# Patient Record
Sex: Female | Born: 1980 | Race: White | Hispanic: No | Marital: Single | Smoking: Never smoker
Health system: Southern US, Community
[De-identification: ages and names within clinical notes are randomized; demographics above are authoritative.]

## PROBLEM LIST (undated history)

## (undated) DIAGNOSIS — F419 Anxiety disorder, unspecified: Secondary | ICD-10-CM

## (undated) DIAGNOSIS — T4145XA Adverse effect of unspecified anesthetic, initial encounter: Secondary | ICD-10-CM

## (undated) DIAGNOSIS — G4700 Insomnia, unspecified: Secondary | ICD-10-CM

## (undated) DIAGNOSIS — J189 Pneumonia, unspecified organism: Secondary | ICD-10-CM

## (undated) DIAGNOSIS — Z8489 Family history of other specified conditions: Secondary | ICD-10-CM

## (undated) DIAGNOSIS — N390 Urinary tract infection, site not specified: Secondary | ICD-10-CM

## (undated) DIAGNOSIS — K219 Gastro-esophageal reflux disease without esophagitis: Secondary | ICD-10-CM

## (undated) DIAGNOSIS — T8859XA Other complications of anesthesia, initial encounter: Secondary | ICD-10-CM

## (undated) DIAGNOSIS — J302 Other seasonal allergic rhinitis: Secondary | ICD-10-CM

## (undated) HISTORY — PX: ABDOMINAL HYSTERECTOMY: SHX81

## (undated) HISTORY — PX: UPPER GASTROINTESTINAL ENDOSCOPY: SHX188

## (undated) HISTORY — PX: BREAST SURGERY: SHX581

---

## 2004-03-24 ENCOUNTER — Ambulatory Visit (HOSPITAL_COMMUNITY): Admission: RE | Admit: 2004-03-24 | Discharge: 2004-03-24 | Payer: Self-pay | Admitting: Obstetrics

## 2004-03-24 IMAGING — US US OB COMP +14 WK
1 series · 13 of 28 positions shown · non-contrast
Comparison: none

CLINICAL DATA: 19 week 2 day gestational age by LMP.  Evaluate dating and anatomy.

[Series 1: us ob comp +14 wk · 0.27mm/px · 13 of 105 slices shown]
[im 4/105]
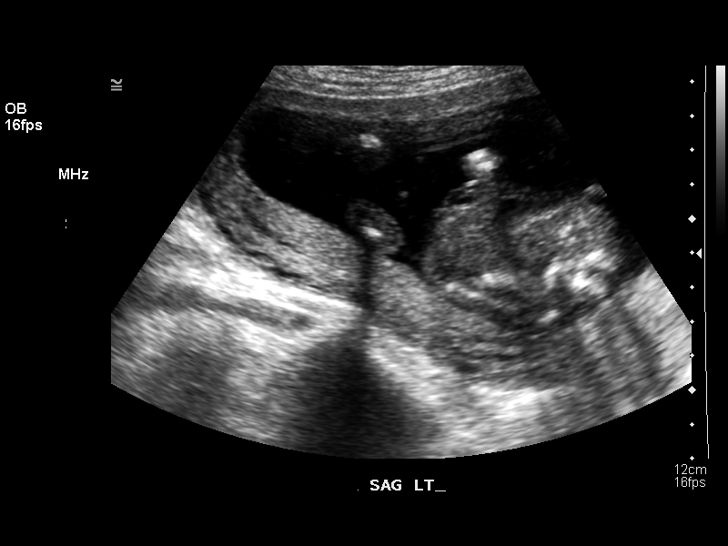
[im 12/105]
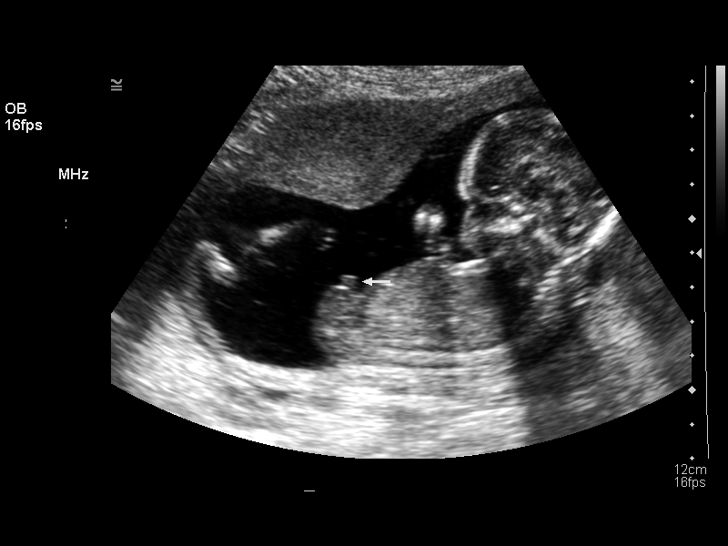
[im 20/105]
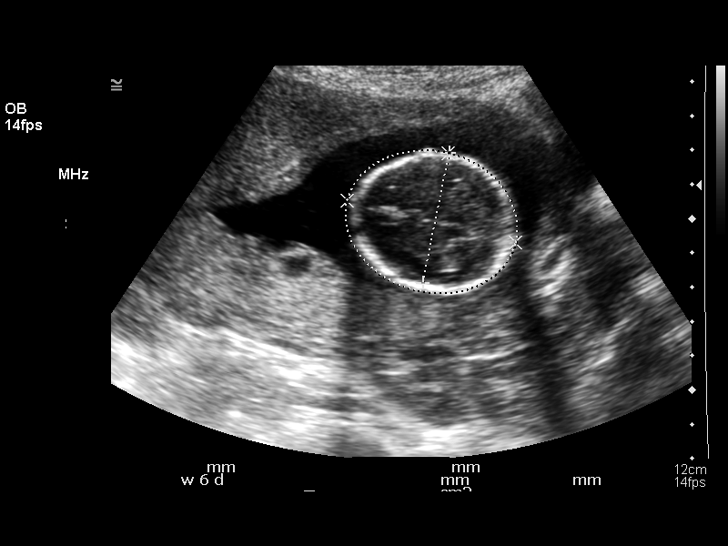
[im 27/105]
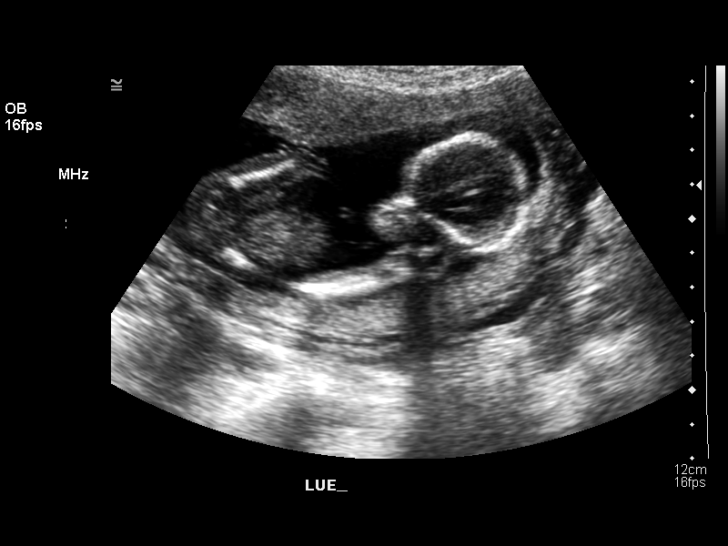
[im 35/105]
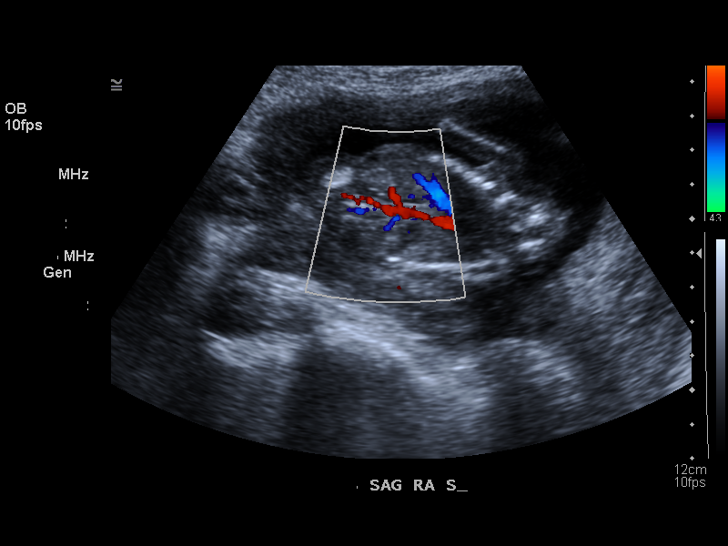
[im 43/105]
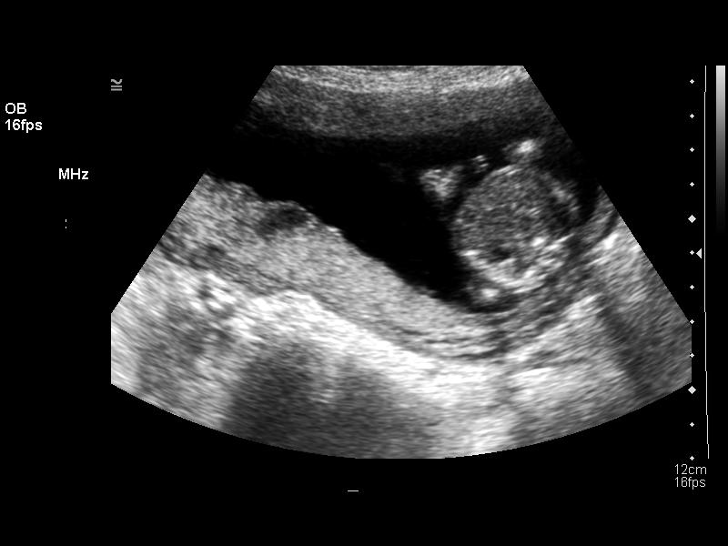
[im 54/105]
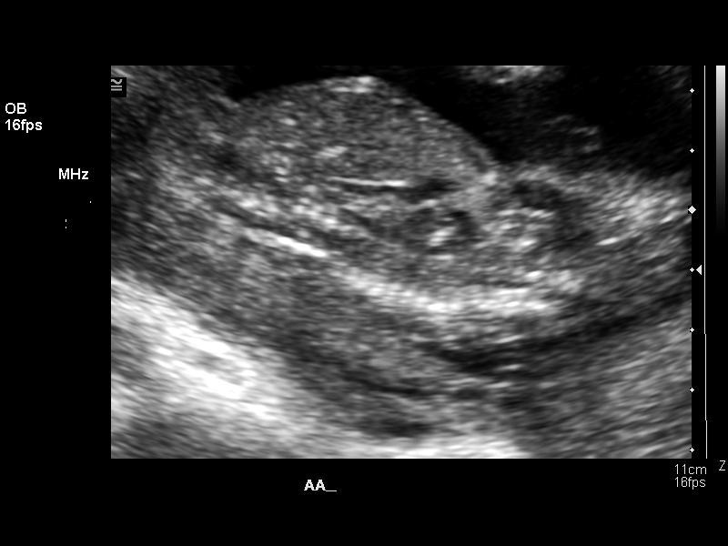
[im 62/105]
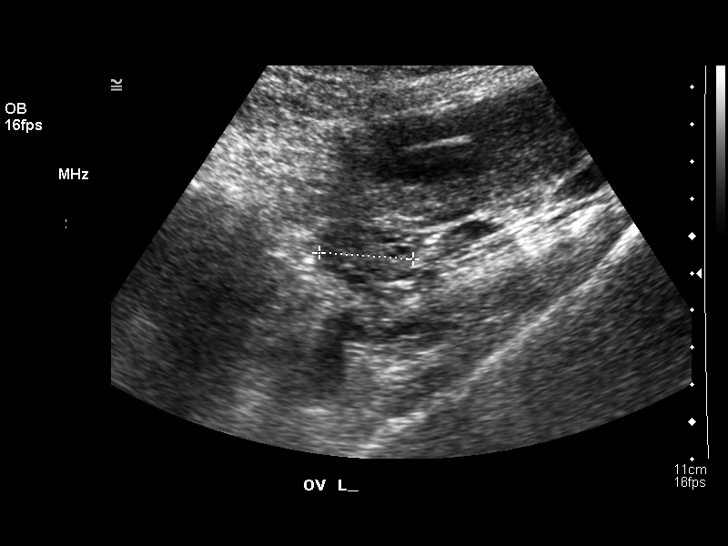
[im 70/105]
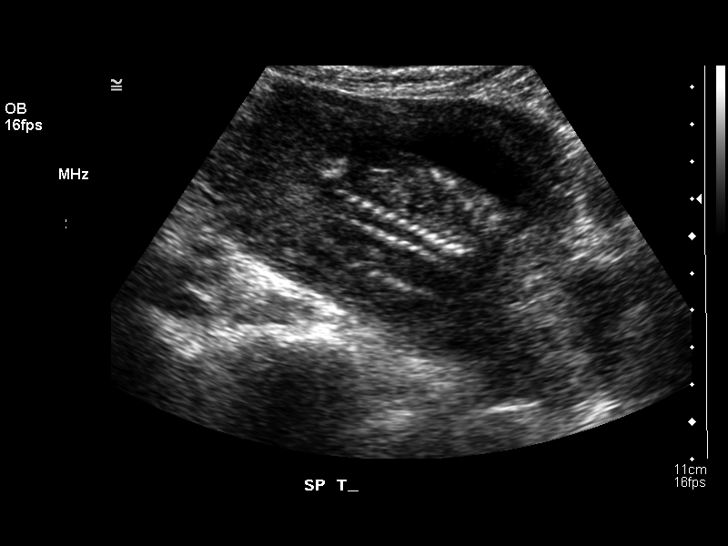
[im 78/105]
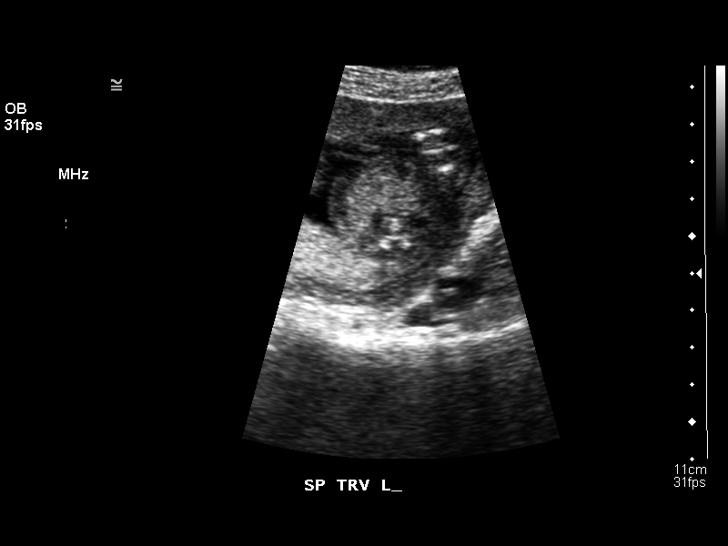
[im 85/105]
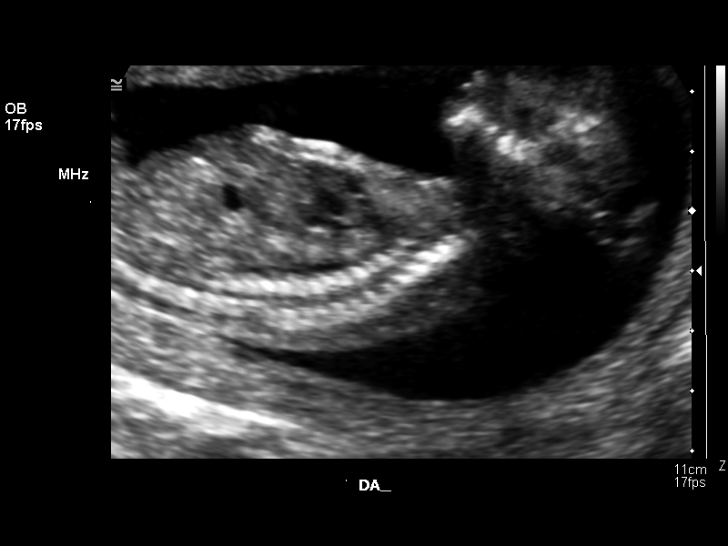
[im 93/105]
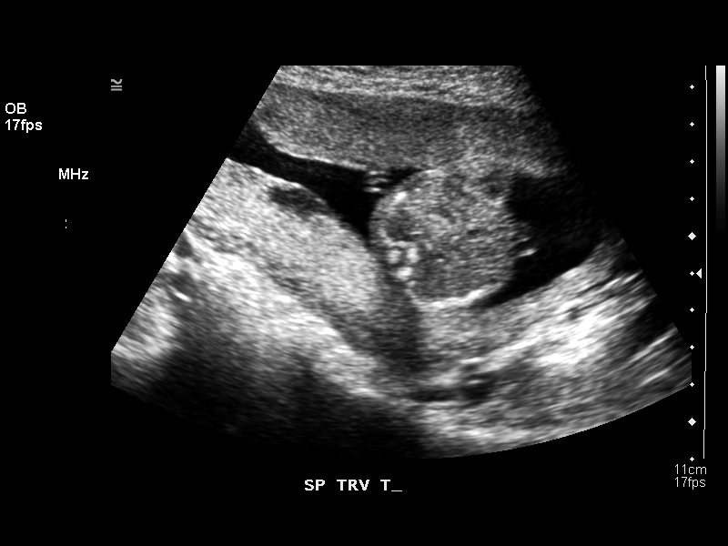
[im 101/105]
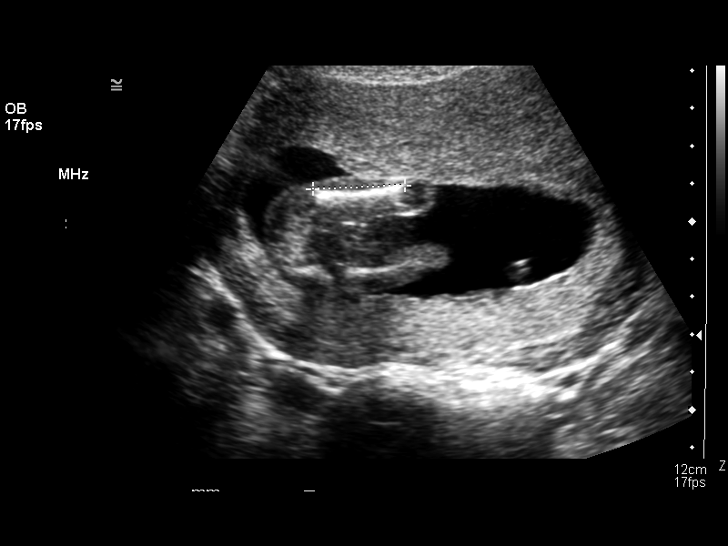

[13 of 28 positions shown; findings below may reference images not displayed]

OBSTETRICAL ULTRASOUND:
 Number of Fetuses:  1
 Heart Rate:  159
 Movement:  Yes
 Breathing:  No  
 Presentation:  Cephalic
 Placental Location:  Posterior
 Grade:  I
 Previa:  No
 Amniotic Fluid (Subjective):  Normal
 Amniotic Fluid (Objective):   5.3 cm Vertical pocket 

 FETAL BIOMETRY
 BPD:   3.8 cm  17 w 5 d
 HC:   14.3 cm  17 w 4 d
 AC:   11.8 cm   17 w 4 d
 FL:    2.4 cm   17 w 3 d

 MEAN GA:  17 w 4 d

 FETAL ANATOMY
 Lateral Ventricles:    Visualized 
 Thalami/CSP:      Visualized 
 Posterior Fossa:  Visualized 
 Nuchal Region:    Visualized 
 Spine:      Visualized 
 4 Chamber Heart on Left:      Visualized 
 Stomach on Left:      Visualized 
 3 Vessel Cord:    Visualized 
 Cord Insertion site:    Visualized 
 Kidneys:  Visualized 
 Bladder:  Visualized 
 Extremities:      Visualized 

 ADDITIONAL ANATOMY VISUALIZED:  LVOT, RVOT, upper lip, orbits, profile, diaphragm, heel, 5th digit, ductal arch, aortic arch, and male genitalia.

 MATERNAL UTERINE AND ADNEXAL FINDINGS
 Cervix:   3.5 cm Transabdominally
 Both ovaries are visualized and are normal in appearance.
IMPRESSION: 1.  Single living intrauterine fetus with mean gestational age of 17 weeks and 4 days and sonographic EDC of [DATE].  This is two weeks behind LMP suggesting that LMP is inaccurate.
 2.  No evidence of fetal anatomic abnormality.  
 3.  No evidence of maternal uterine or adnexal abnormality.

 </u12:p>

## 2004-07-16 ENCOUNTER — Ambulatory Visit (HOSPITAL_COMMUNITY): Admission: RE | Admit: 2004-07-16 | Discharge: 2004-07-16 | Payer: Self-pay | Admitting: Obstetrics & Gynecology

## 2004-07-16 IMAGING — US US OB FOLLOW-UP
1 series · 13 of 28 positions shown · non-contrast
Comparison: none

CLINICAL DATA: 24-year-old female with 33 week 6 day gestation.  Evaluate growth.

[Series 1: us ob follow-up · 0.37mm/px · 13 of 33 slices shown]
[im 2/33]
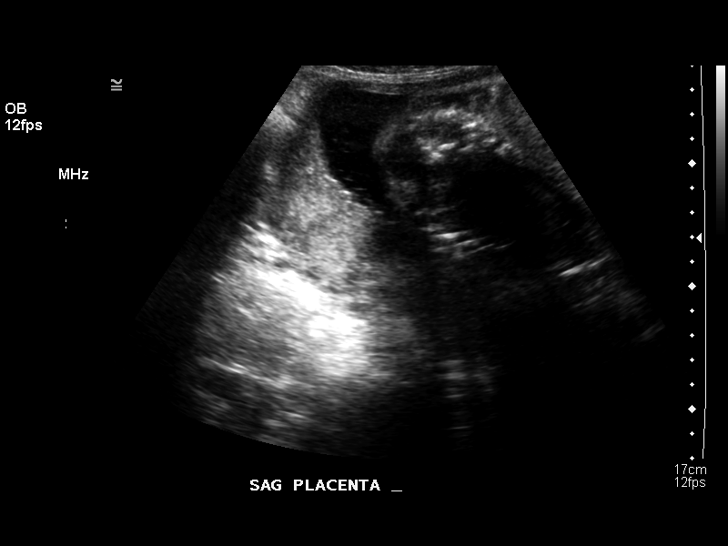
[im 4/33]
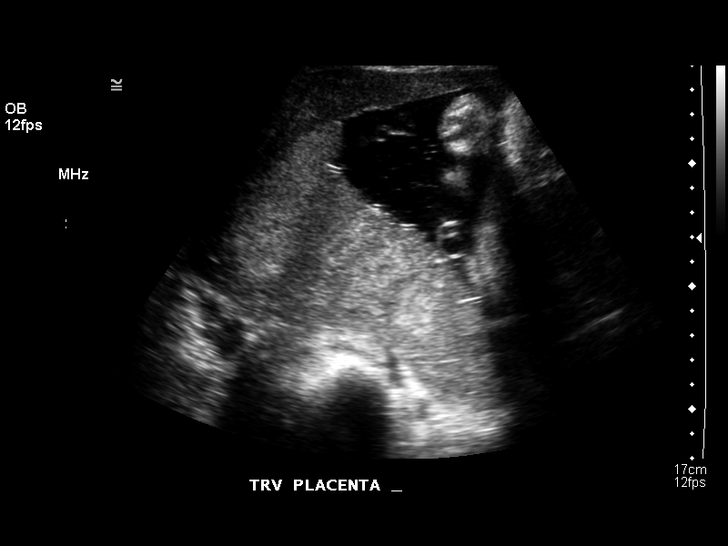
[im 6/33]
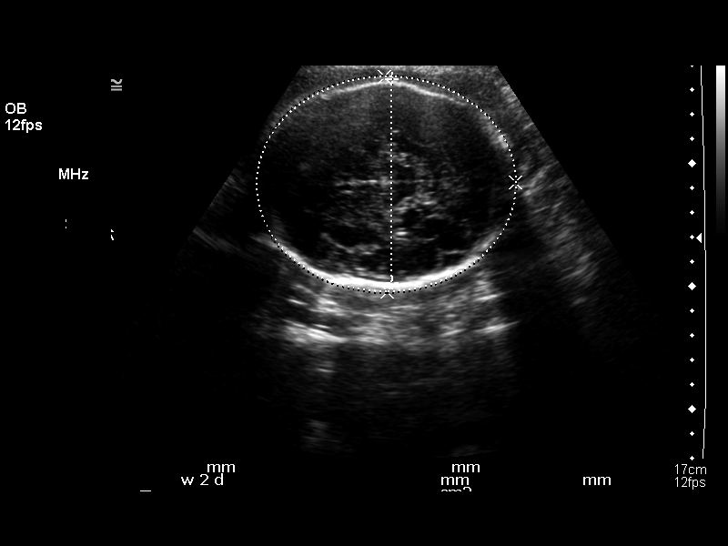
[im 9/33]
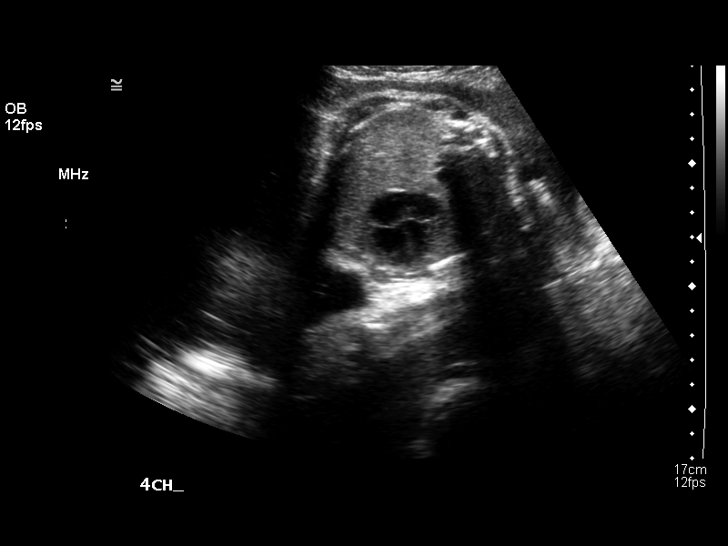
[im 11/33]
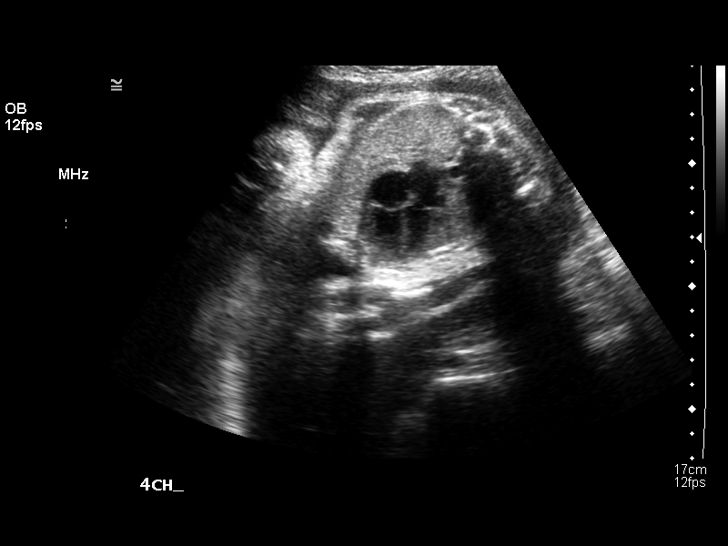
[im 14/33]
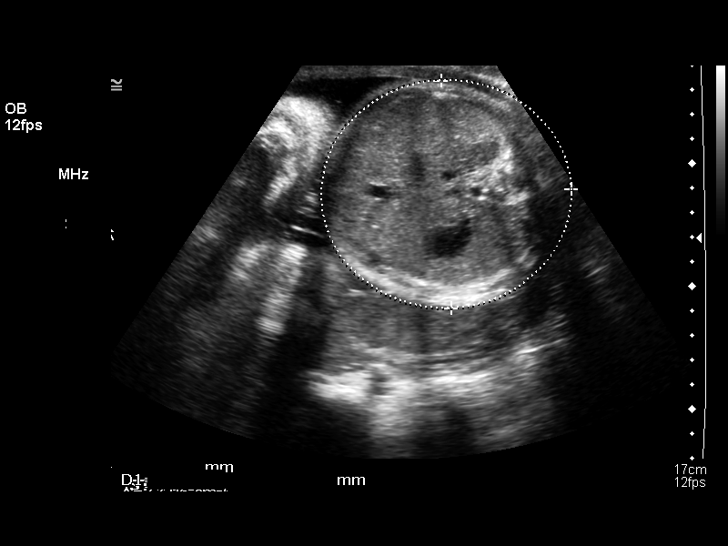
[im 17/33]
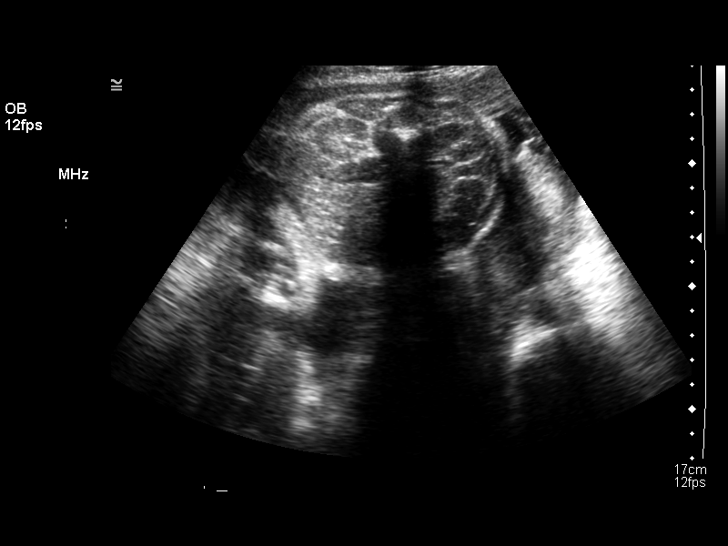
[im 19/33]
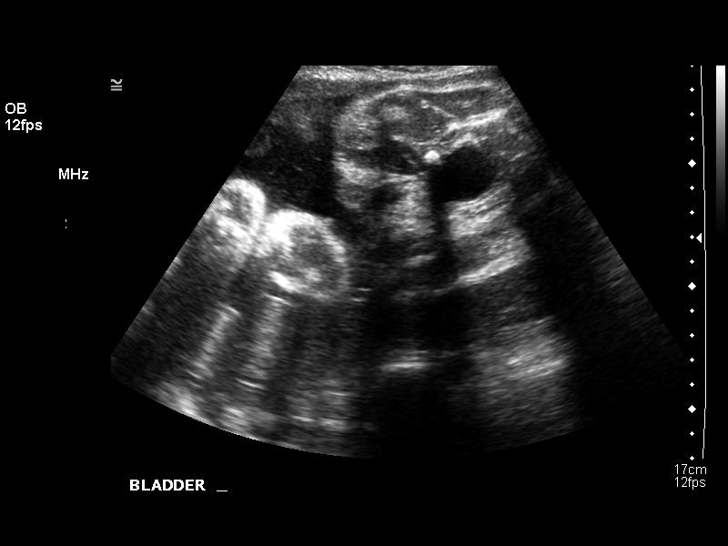
[im 22/33]
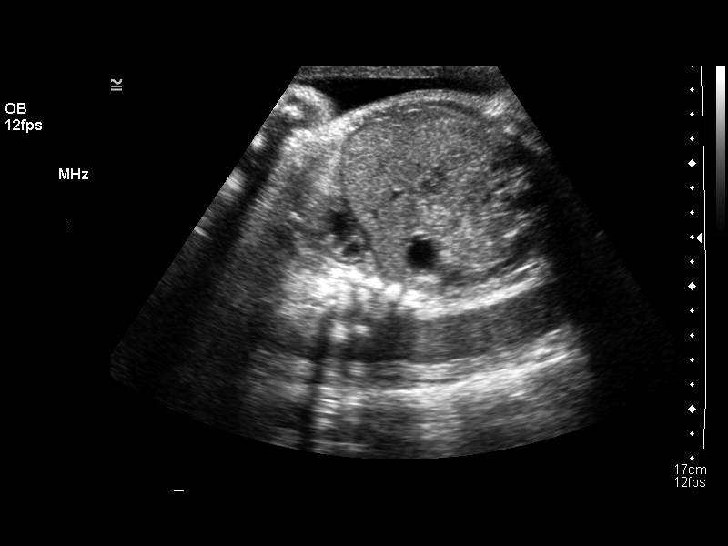
[im 24/33]
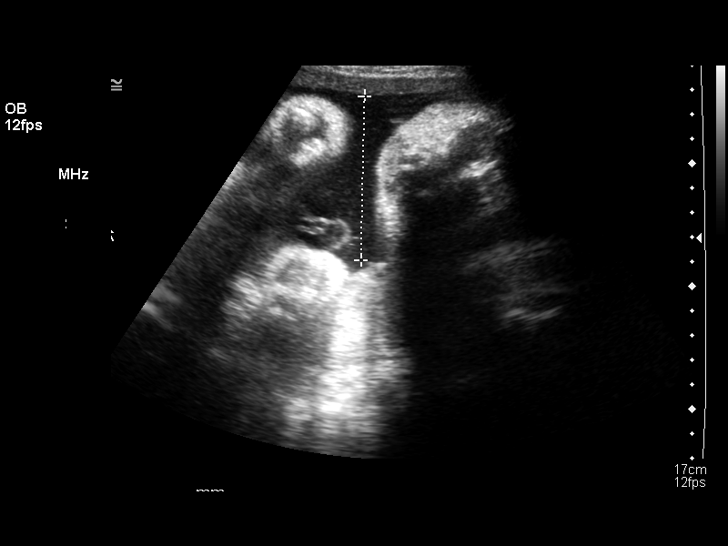
[im 27/33]
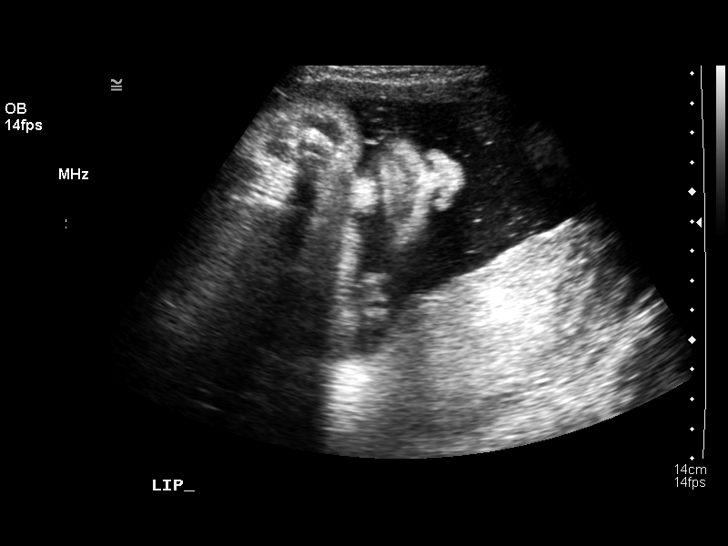
[im 29/33]
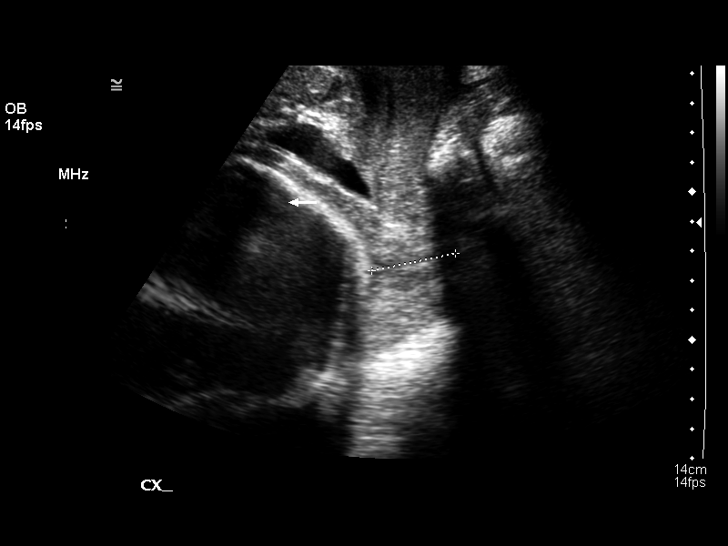
[im 31/33]
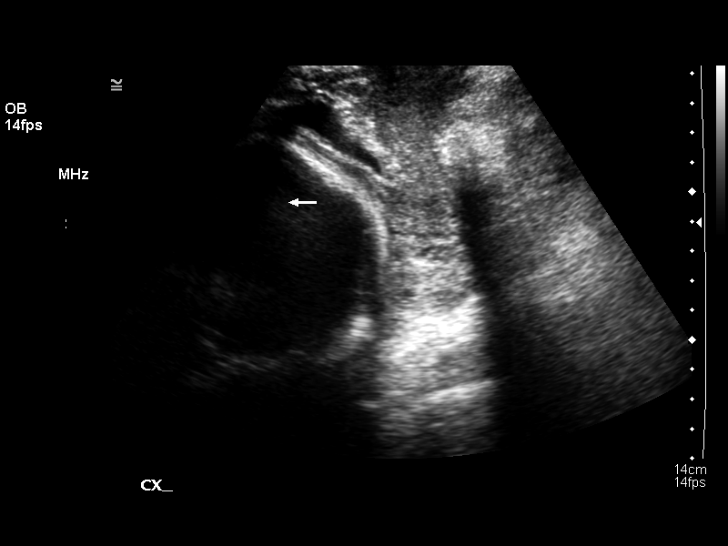

[13 of 28 positions shown; findings below may reference images not displayed]

OBSTETRICAL ULTRASOUND RE-EVALUATION:
 Number of Fetuses:  1
 Heart Rate:  144
 Movement:  Yes
 Breathing:  Yes
 Presentation:  Cephalic
 Placental Location:  Posterior
 Grade:  I
 Previa:  No
 Amniotic Fluid (subjective):  Normal
 Amniotic Fluid (objective):  15.6 cm AFI (5th -95th%ile = 8.1 – 24.8 cm for 34 wks)

 FETAL BIOMETRY
 BPD:  8.2 cm  33 w 9 d
 HC:  30.2 cm  33 w 4 d
 AC:  29.2 cm  33 w 2 d
 FL:   6.4 cm   33 w 0 d

 Mean GA:  33 w 2 d
 Assigned GA:  33 w 6 d

 EFW:  [W6] g (H) 25th – 50th%ile ([W6] – [W6] g) For 34 wks

 FETAL ANATOMY
 Lateral Ventricles:  Visualized 
 Thalami/CSP:  Previously seen 
 Posterior Fossa:  Previously seen 
 Nuchal Region:  Previously seen 
 Spine:  Previously seen 
 4 Chamber Heart on Left:  Visualized 
 Stomach on Left:  Visualized 
 3 Vessel Cord:  Previously seen 
 Cord Insertion Site:  Previously seen 
 Kidneys:  Visualized 
 Bladder:  Visualized 
 Extremities:  Previously seen 

 MATERNAL UTERINE AND ADNEXAL FINDINGS
 Cervix:  2.6 cm Translabially
IMPRESSION: 1.  Single living intrauterine gestation in cephalic presentation with assigned gestational age of 33 weeks 6 days and estimated gestational age by this ultrasound of 33 weeks 2 days, appropriate growth since the previous study.
 2.  Normal amniotic fluid volume with posterior placenta.  No previa.
 3.  Fetal anatomy as described.

## 2004-07-26 ENCOUNTER — Observation Stay (HOSPITAL_COMMUNITY): Admission: AD | Admit: 2004-07-26 | Discharge: 2004-07-27 | Payer: Self-pay | Admitting: Obstetrics

## 2004-08-03 ENCOUNTER — Inpatient Hospital Stay (HOSPITAL_COMMUNITY): Admission: AD | Admit: 2004-08-03 | Discharge: 2004-08-05 | Payer: Self-pay | Admitting: Obstetrics

## 2007-03-03 ENCOUNTER — Ambulatory Visit: Payer: Self-pay | Admitting: Psychiatry

## 2007-03-03 ENCOUNTER — Inpatient Hospital Stay (HOSPITAL_COMMUNITY): Admission: AD | Admit: 2007-03-03 | Discharge: 2007-03-06 | Payer: Self-pay | Admitting: Psychiatry

## 2007-03-03 ENCOUNTER — Emergency Department (HOSPITAL_COMMUNITY): Admission: EM | Admit: 2007-03-03 | Discharge: 2007-03-03 | Payer: Self-pay | Admitting: Emergency Medicine

## 2007-11-05 ENCOUNTER — Inpatient Hospital Stay (HOSPITAL_COMMUNITY): Admission: AD | Admit: 2007-11-05 | Discharge: 2007-11-05 | Payer: Self-pay | Admitting: Obstetrics & Gynecology

## 2007-11-05 IMAGING — US US PELVIS COMPLETE MODIFY
1 series · 14 of 25 positions shown · non-contrast
Comparison: None

Clinical Data abdominal pain

 TRANSABDOMINAL ULTRASOUND OF PELVIS
TECHNIQUE: Transabdominal ultrasound examination of the pelvis was
performed including evaluation of the uterus, ovaries, adnexal
regions, and pelvic cul-de-sac.

[Series 1: us pelvis complete modify · 0.26mm/px · 14 of 39 slices shown]
[im 1/39]
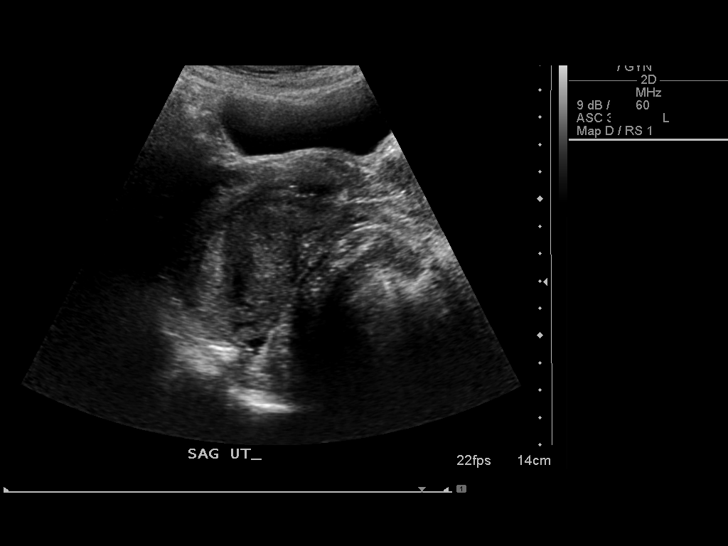
[im 4/39]
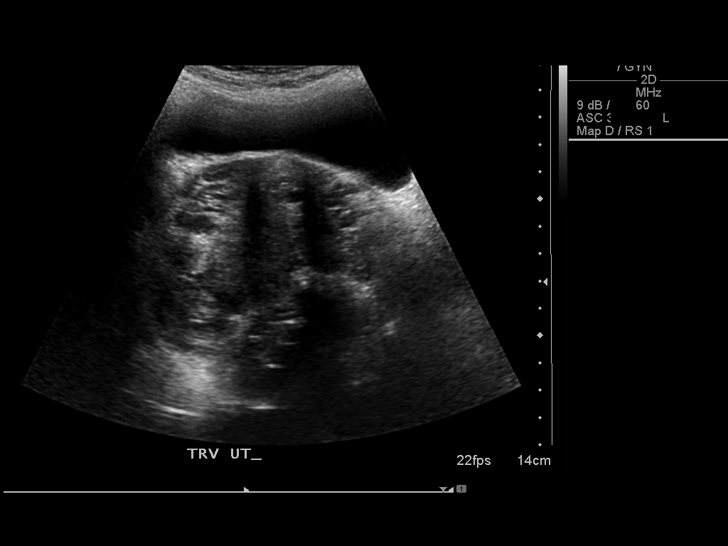
[im 7/39]
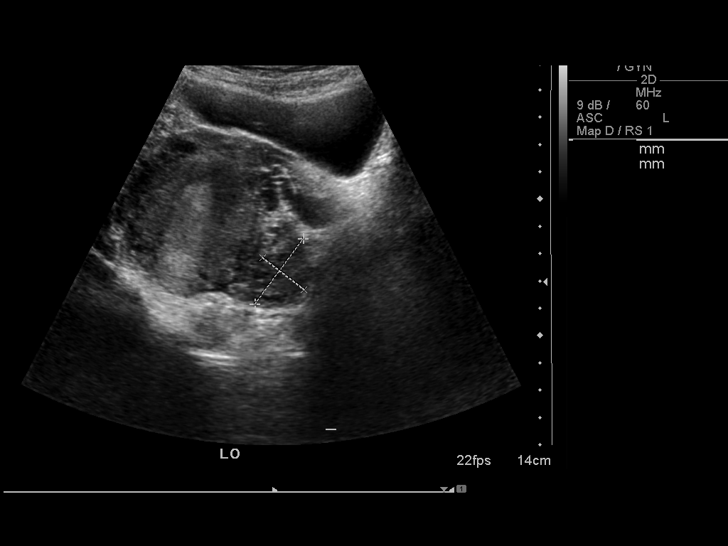
[im 10/39]
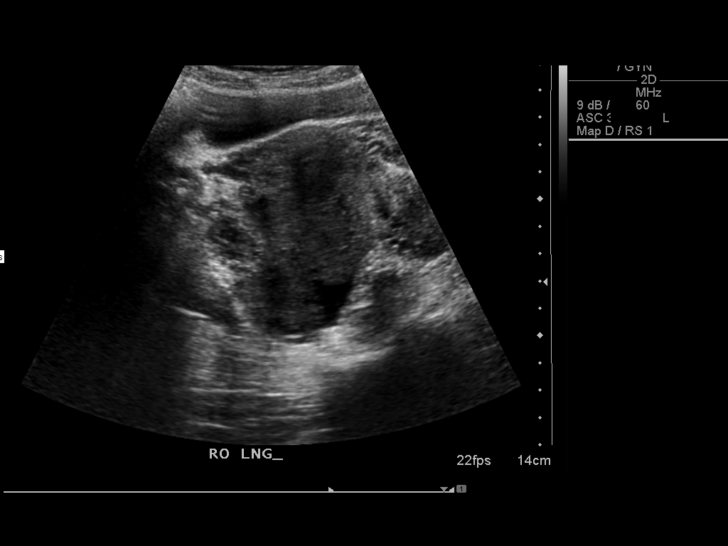
[im 13/39]
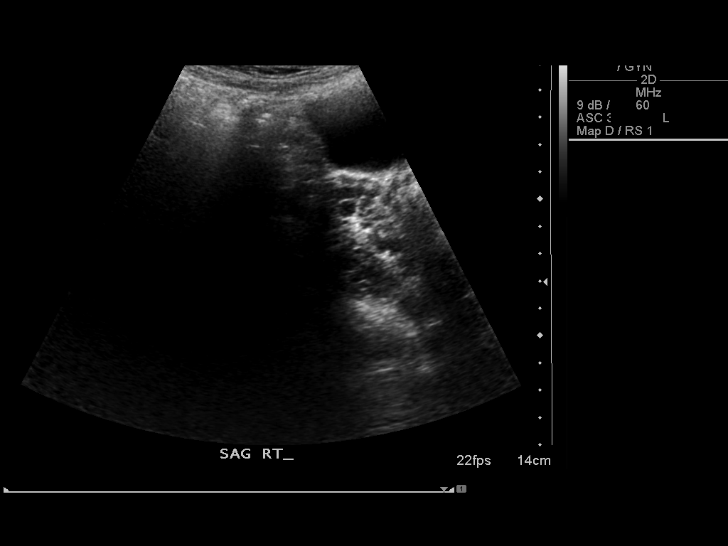
[im 15/39]
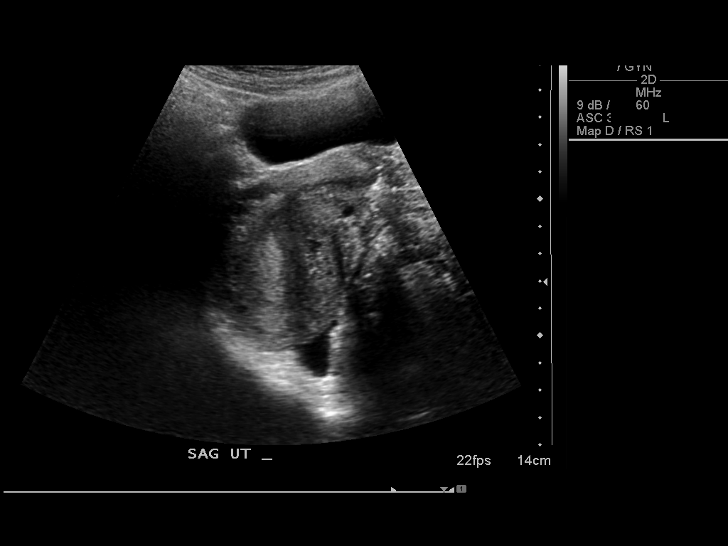
[im 18/39]
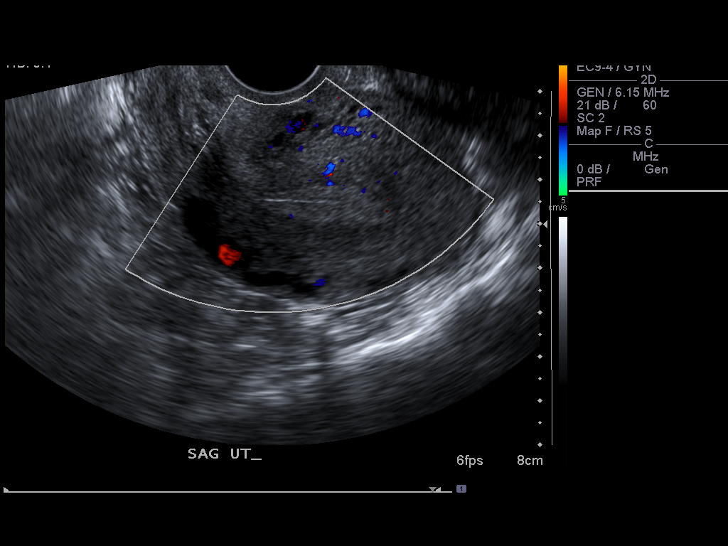
[im 21/39]
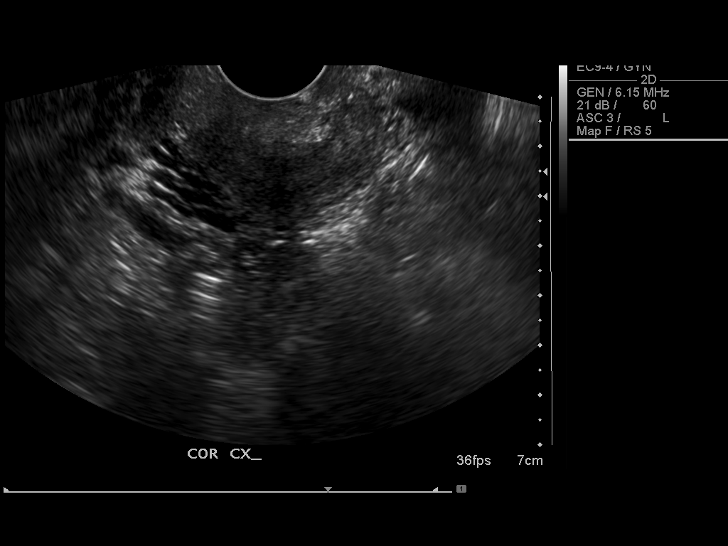
[im 24/39]
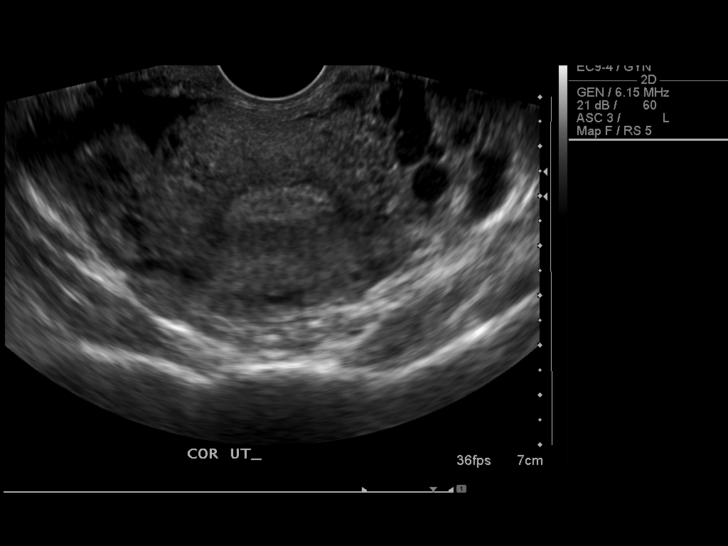
[im 26/39]
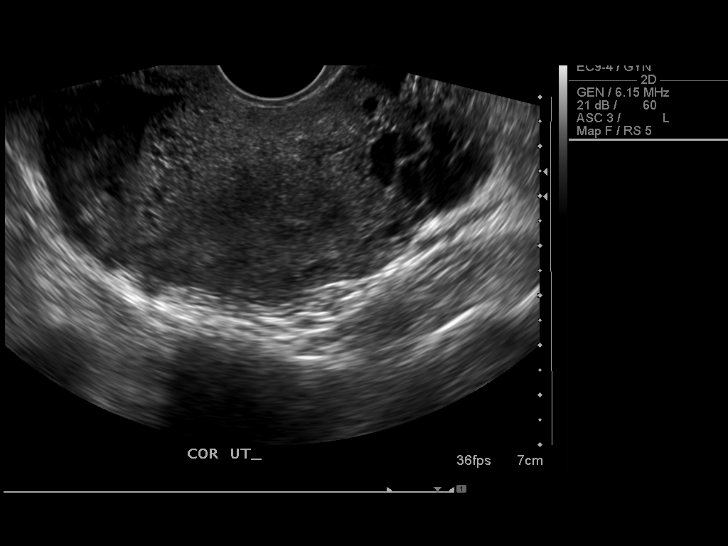
[im 29/39]
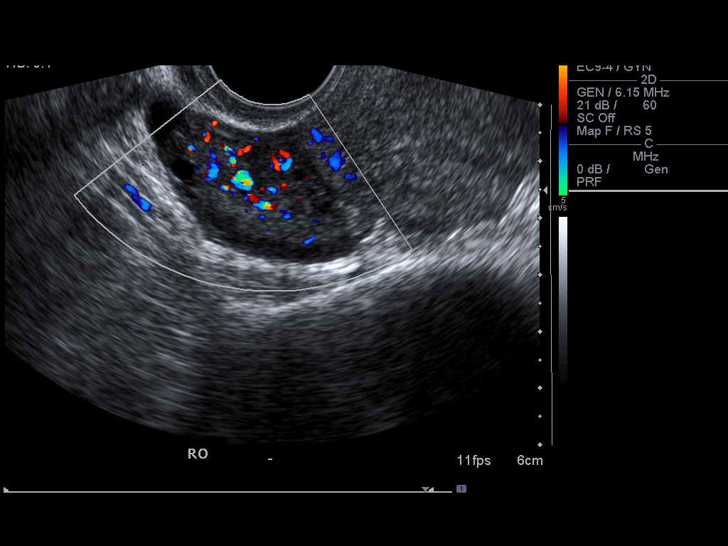
[im 32/39]
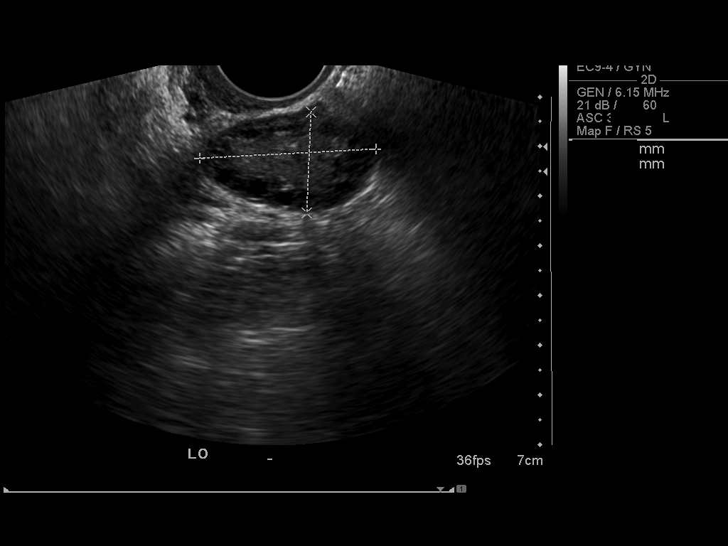
[im 35/39]
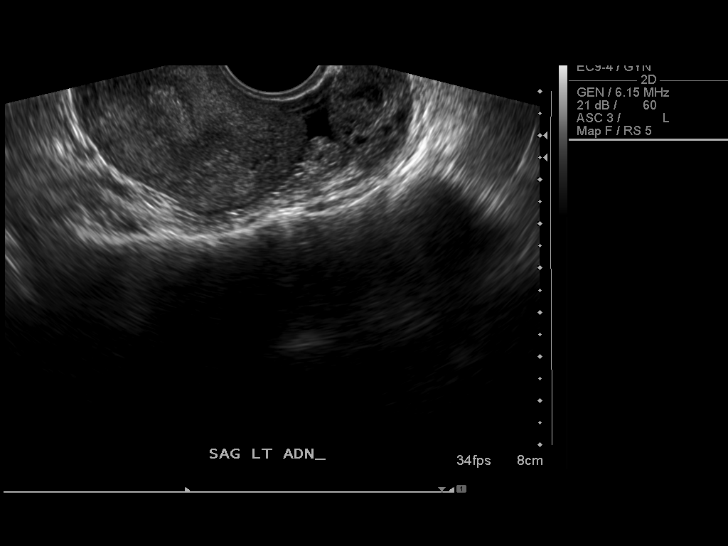
[im 39/39]
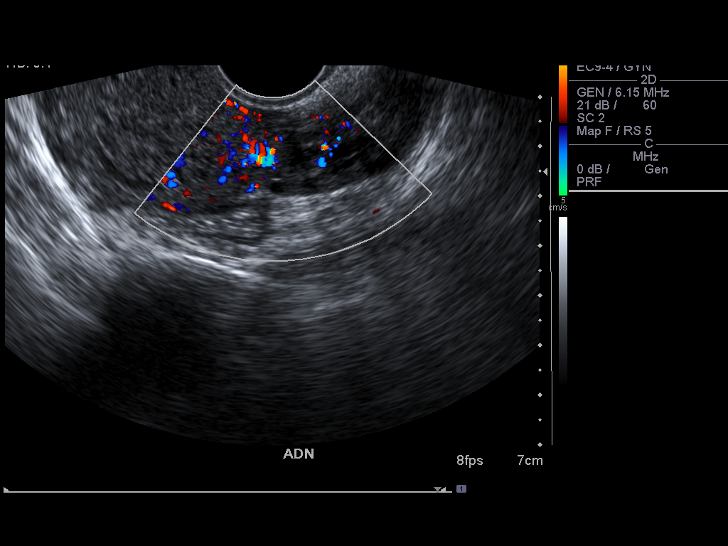

[14 of 25 positions shown; findings below may reference images not displayed]

FINDINGS: The uterus measures 9.2 x 4.4 x 5.1 cm.  The endometrial
stripe is normal.  The AP thickness equals 8.2 mm.

The right ovary is normal measuring 3.8 x 2.2 x 3.4 cm.  The left
ovary is normal measuring 3.6  x 2.0  x 2.3 cm. There is minor
fluid is nonspecific.  Adjacent to the left adnexa is a 0.6 x 0.6 x
0.9 cm area of increased echo texture.  This is of uncertain
etiology.  This does appear to be separate from the uterus and the
ovary.  I question whether this represents a small loop of bowel.
IMPRESSION: Uterus and ovaries normal by ultrasound.

## 2008-06-21 ENCOUNTER — Inpatient Hospital Stay (HOSPITAL_COMMUNITY): Admission: AD | Admit: 2008-06-21 | Discharge: 2008-06-22 | Payer: Self-pay | Admitting: Obstetrics & Gynecology

## 2008-06-21 ENCOUNTER — Ambulatory Visit: Payer: Self-pay | Admitting: Physician Assistant

## 2008-06-22 IMAGING — US US OB TRANSVAGINAL MODIFY
1 series · 14 of 28 positions shown · non-contrast
Comparison: none

OBSTETRICAL ULTRASOUND:
 This ultrasound exam was performed in the [HOSPITAL] Ultrasound Department.  The OB US report was generated in the AS system, and faxed to the ordering physician.  This report is also available in [REDACTED] PACS.

[Series 1: us ob comp less 14 wks · 14 of 44 slices shown]
[im 2/44]
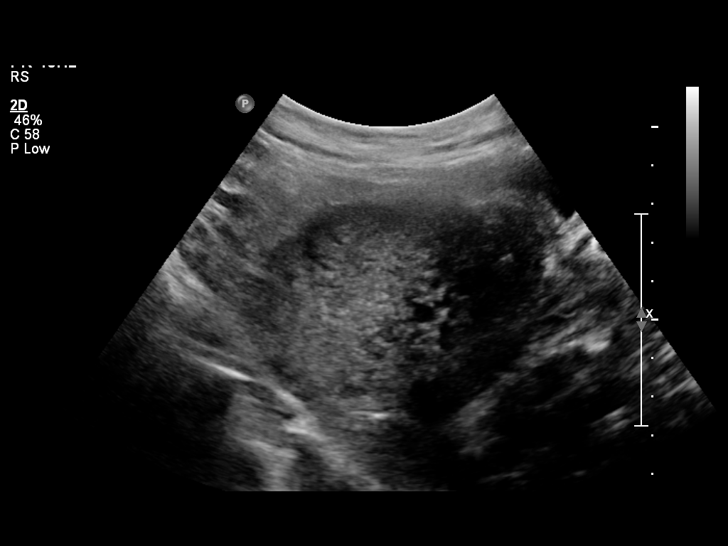
[im 5/44]
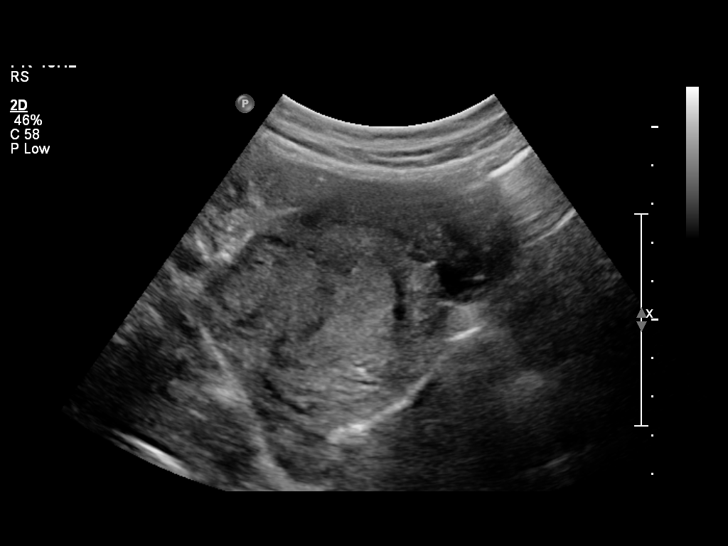
[im 8/44]
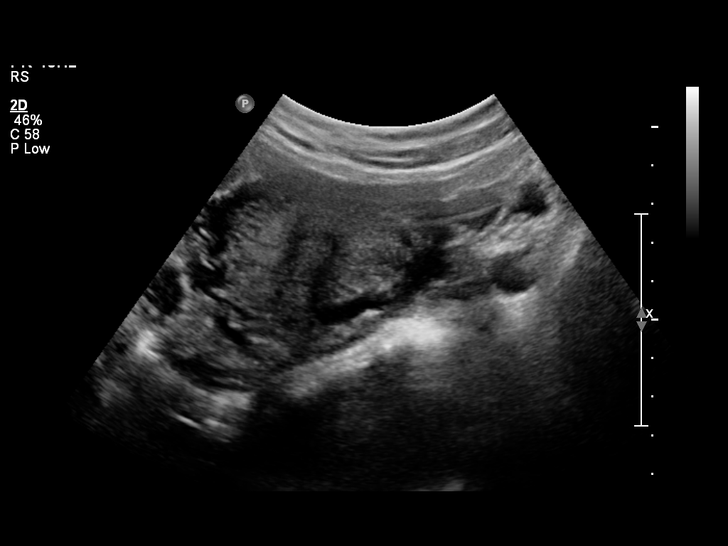
[im 12/44]
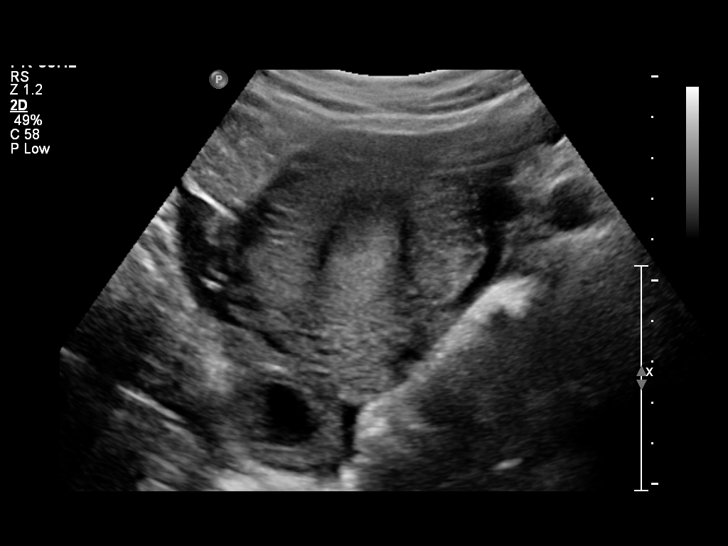
[im 15/44]
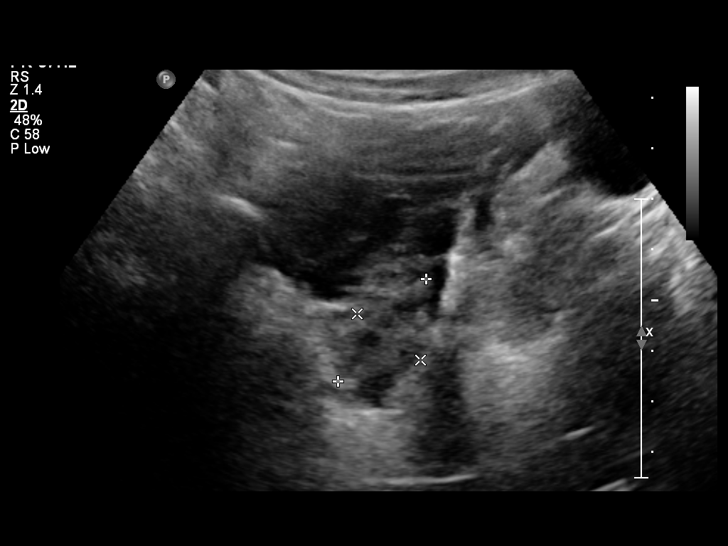
[im 18/44]
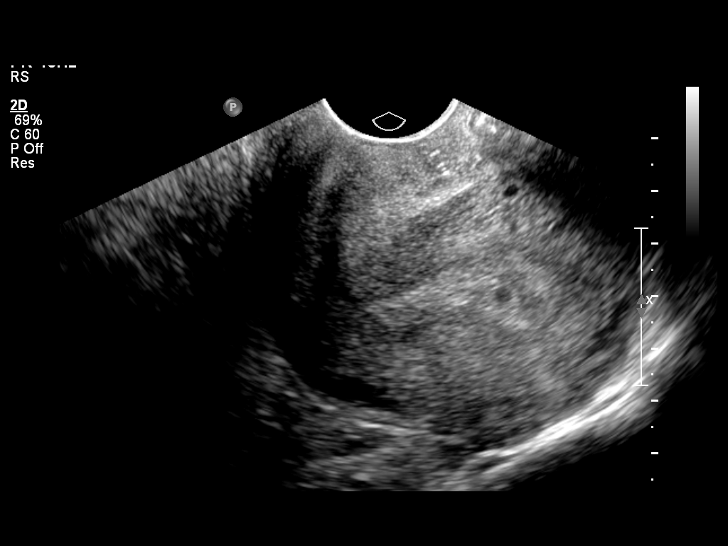
[im 21/44]
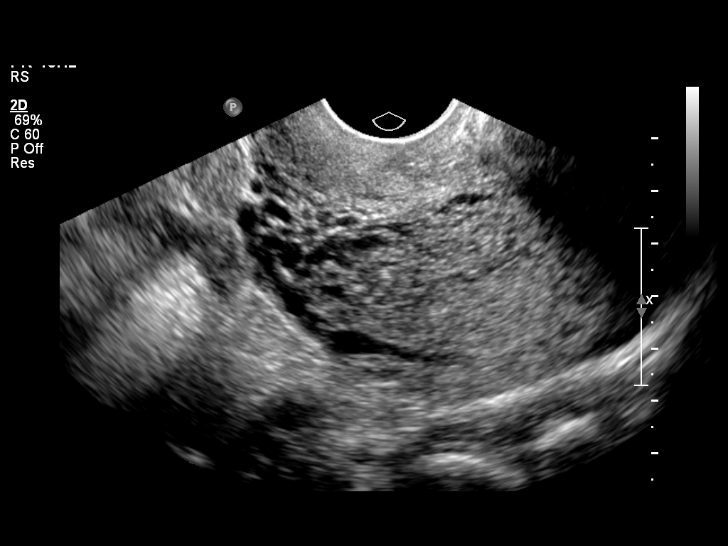
[im 24/44]
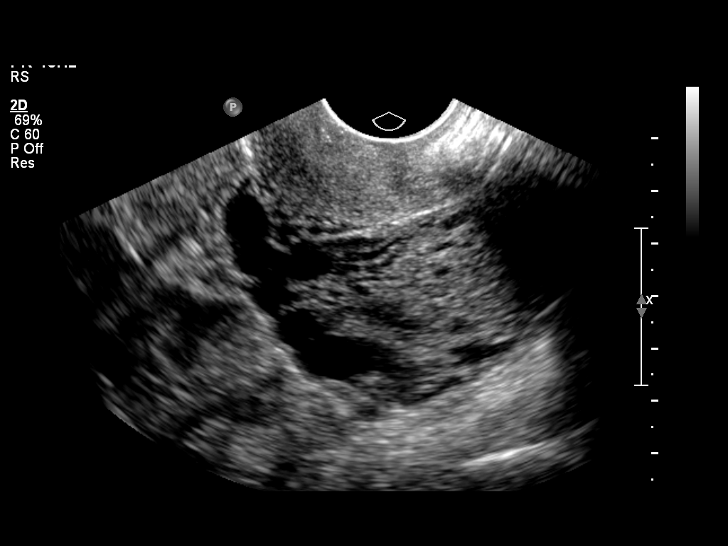
[im 28/44]
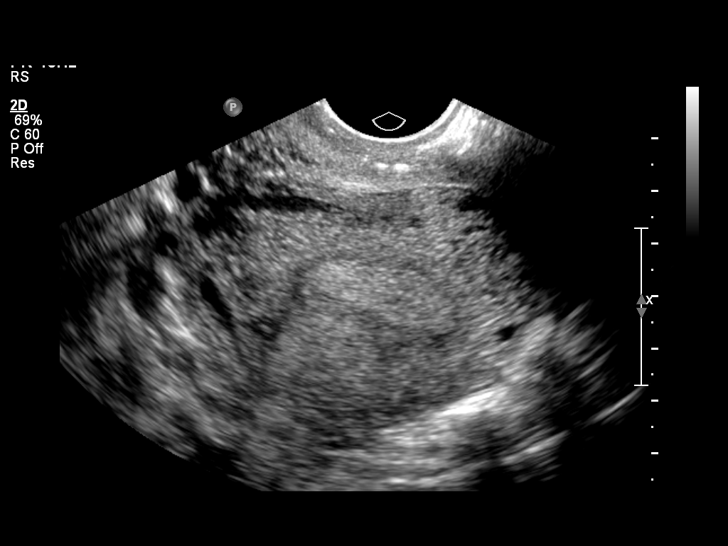
[im 31/44]
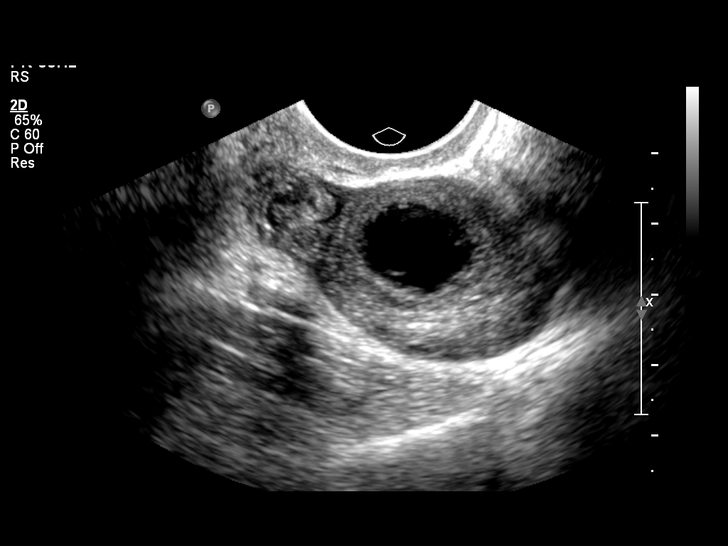
[im 34/44]
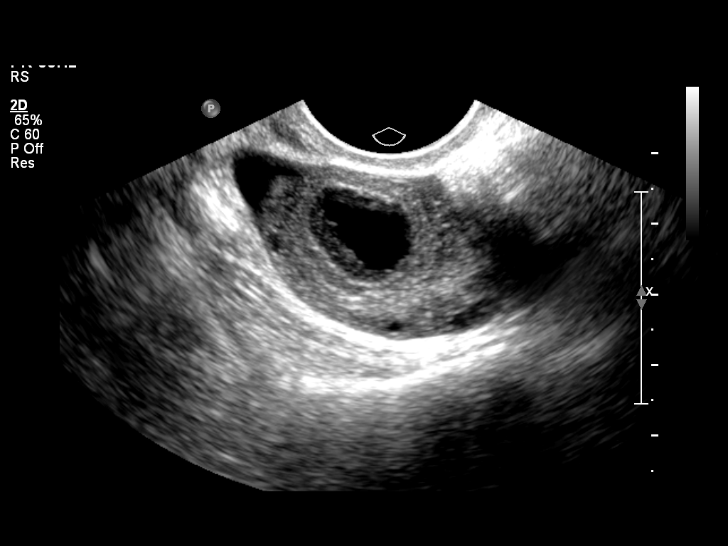
[im 37/44]
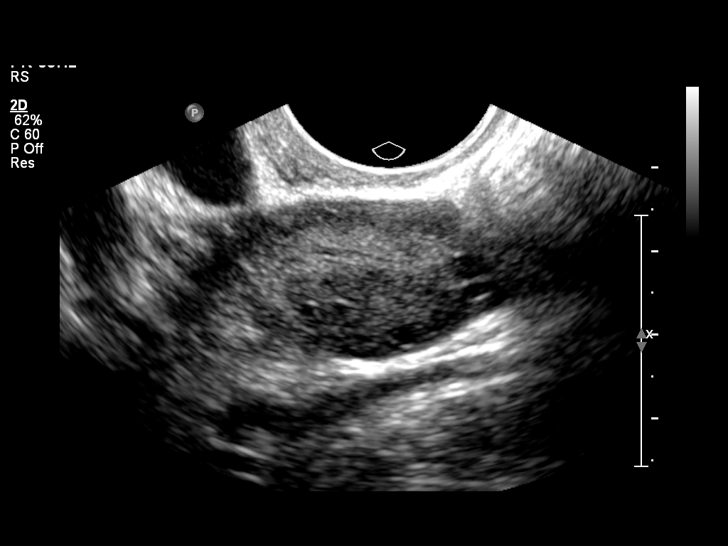
[im 40/44]
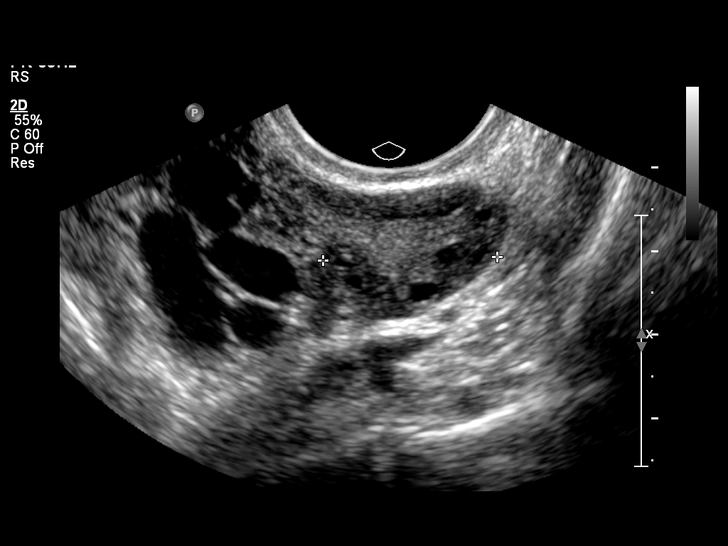
[im 44/44]
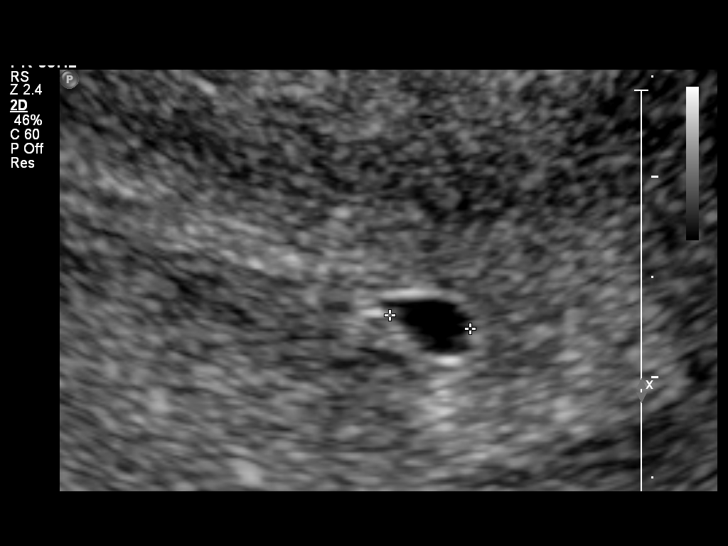

[14 of 28 positions shown; findings below may reference images not displayed]

IMPRESSION: See AS Obstetric US report.

## 2009-02-01 ENCOUNTER — Emergency Department (HOSPITAL_COMMUNITY): Admission: EM | Admit: 2009-02-01 | Discharge: 2009-02-01 | Payer: Self-pay | Admitting: Emergency Medicine

## 2009-02-01 IMAGING — CR DG FOOT COMPLETE 3+V*R*
3 series · 3 of 3 positions shown · non-contrast
Comparison: None

CLINICAL DATA: Fall.  Lateral foot pain.

RIGHT FOOT COMPLETE - 3+ VIEW

[t foot ap right]
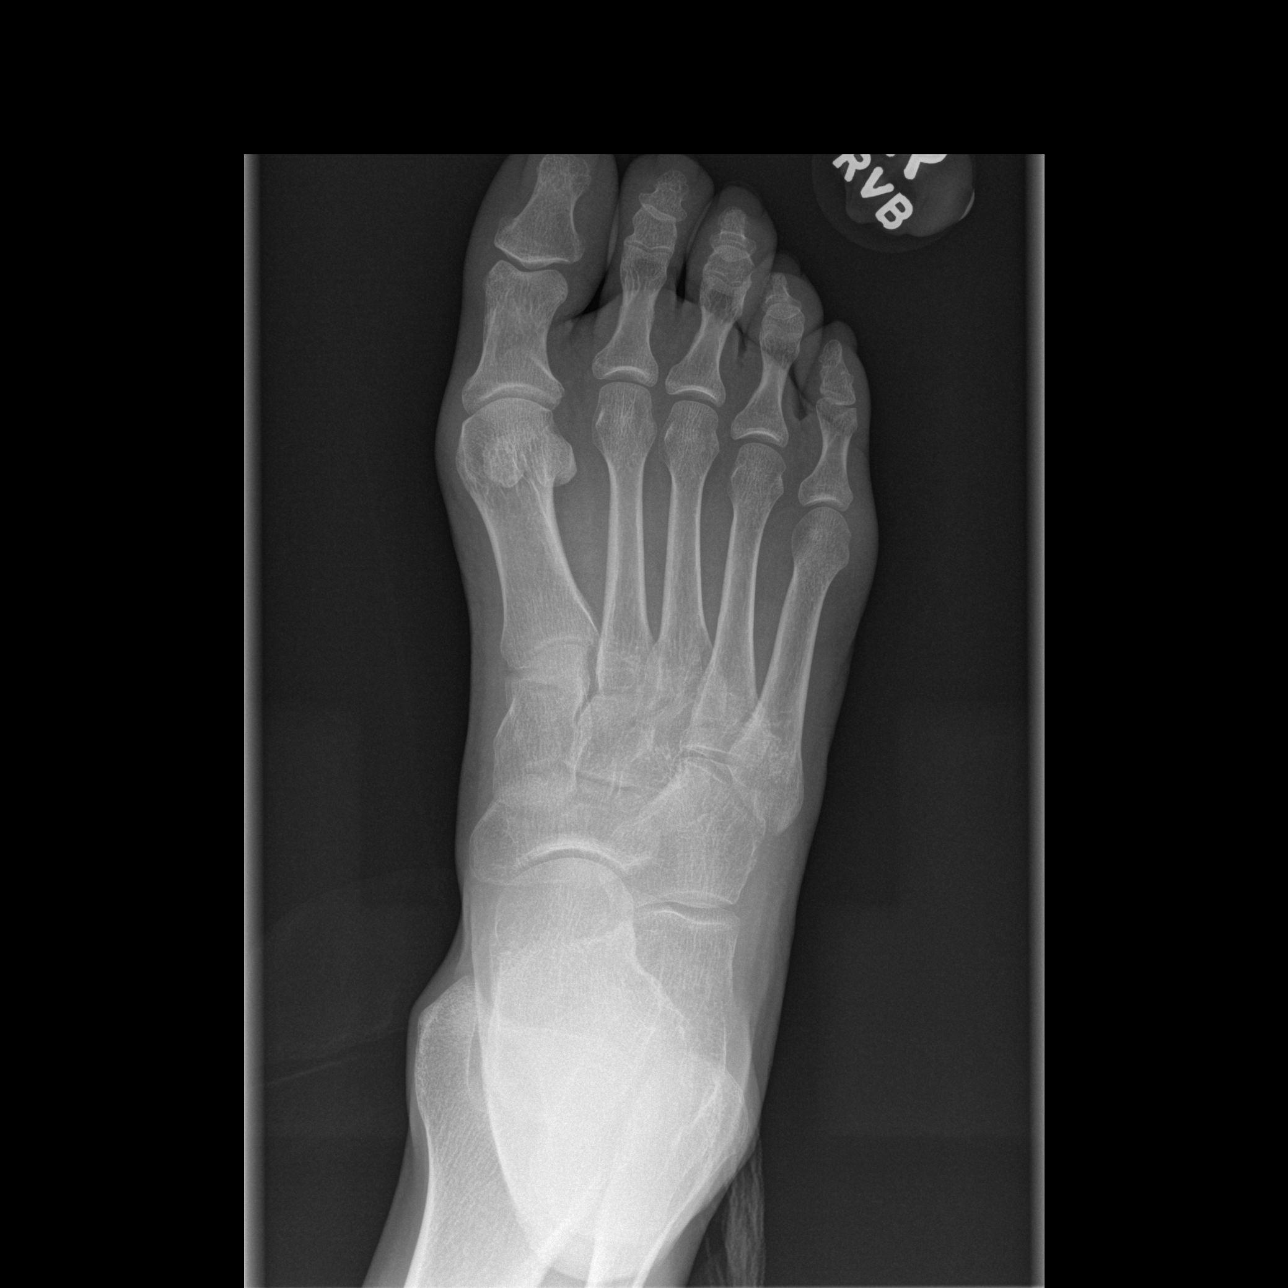

[t foot oblique right]
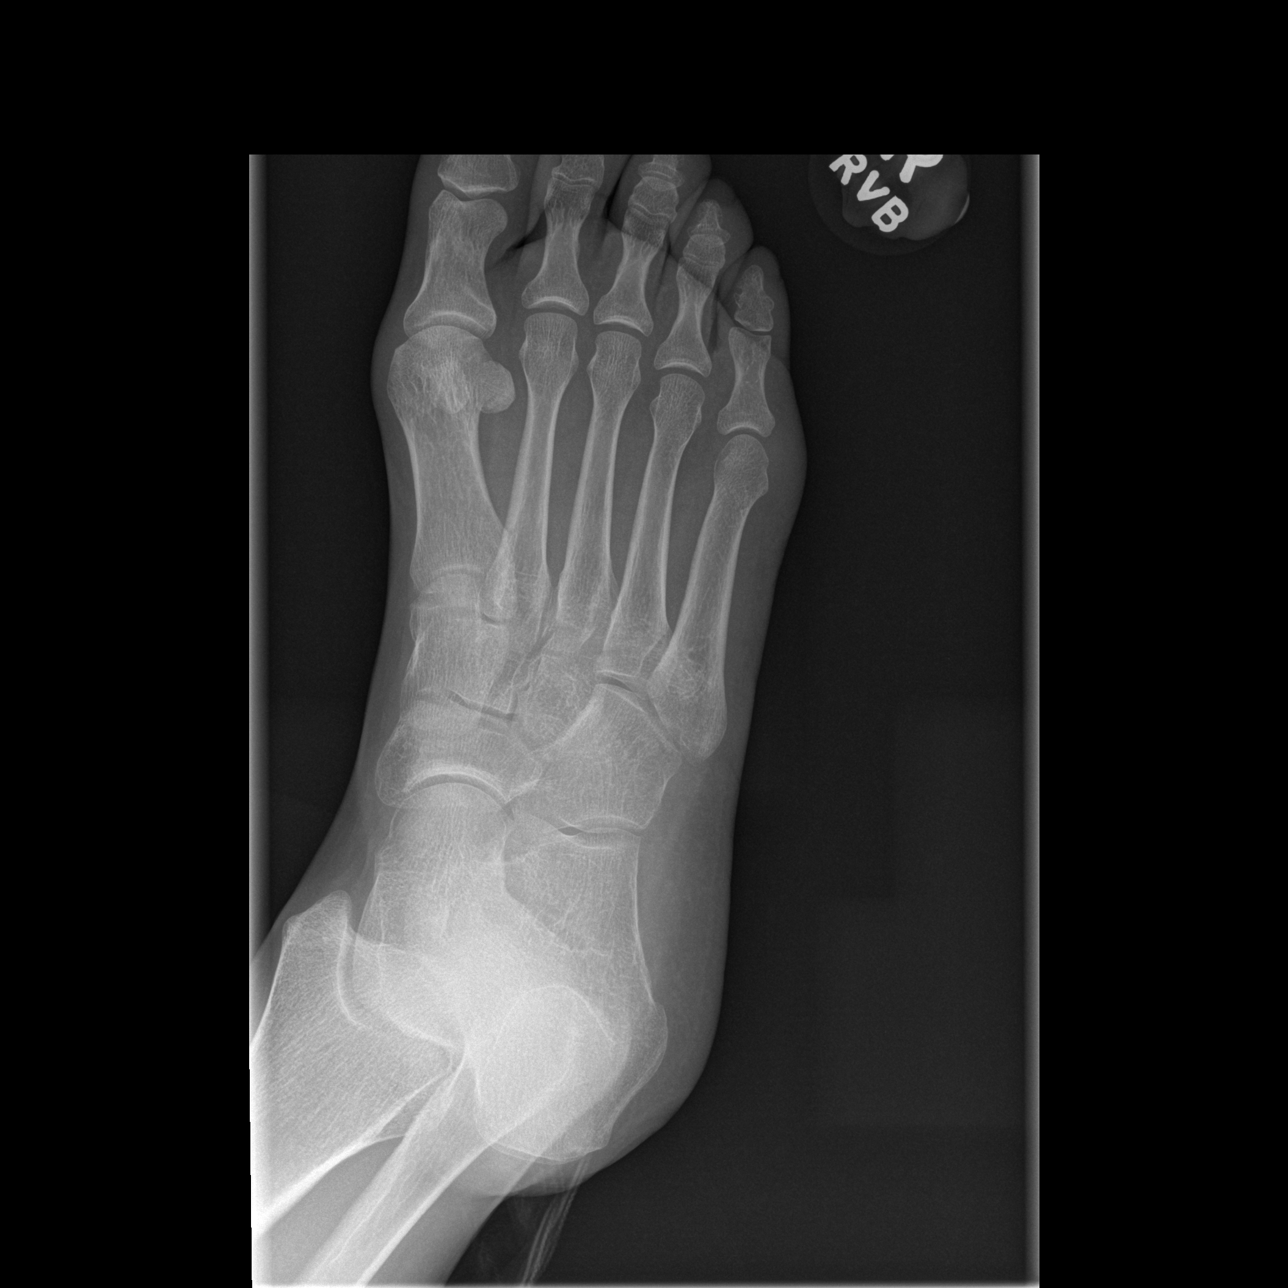

[t foot lat right]
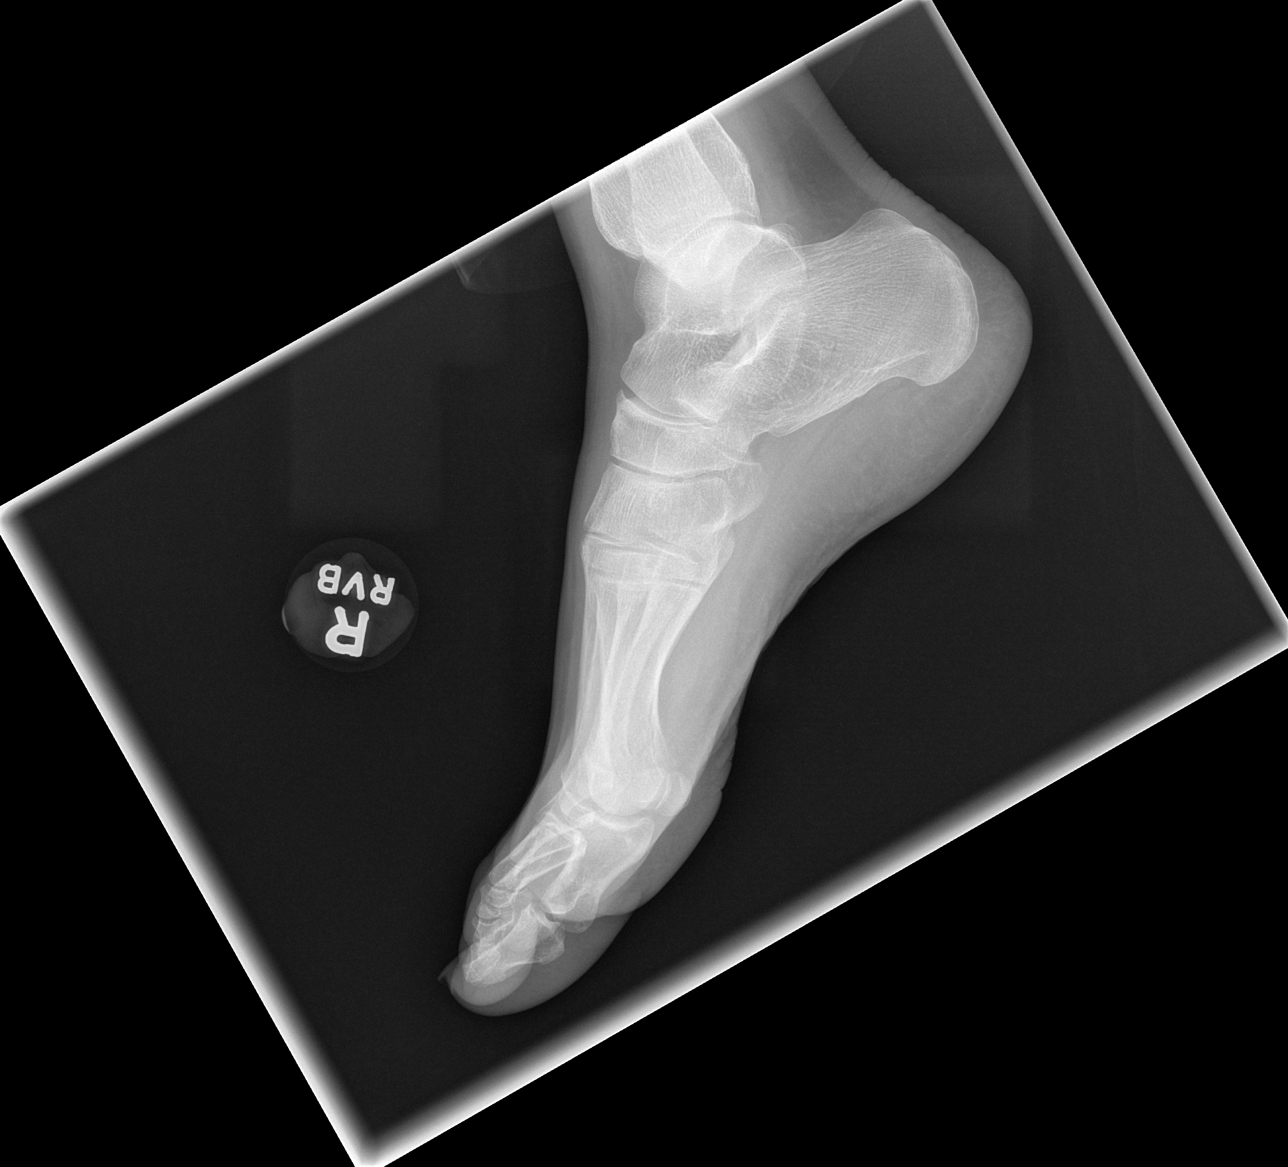

[3 of 3 positions shown; findings below may reference images not displayed]

FINDINGS: There is no evidence of fracture or dislocation.  No
other significant bone abnormality identified.  Soft tissues are
unremarkable.
IMPRESSION: Negative.

## 2010-06-17 LAB — ABO/RH: ABO/RH(D): O POS

## 2010-06-17 LAB — WET PREP, GENITAL
Trich, Wet Prep: NONE SEEN
Yeast Wet Prep HPF POC: NONE SEEN

## 2010-06-17 LAB — URINALYSIS, ROUTINE W REFLEX MICROSCOPIC
Protein, ur: NEGATIVE mg/dL
Specific Gravity, Urine: >1.03 — ABNORMAL HIGH (ref 1.005–1.030)
pH: 5.5 (ref 5.0–8.0)

## 2010-06-17 LAB — GC/CHLAMYDIA PROBE AMP, GENITAL: GC Probe Amp, Genital: NEGATIVE

## 2010-06-17 LAB — POCT PREGNANCY, URINE: Preg Test, Ur: POSITIVE
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
## 2010-12-11 LAB — BASIC METABOLIC PANEL
BUN: 5 — ABNORMAL LOW
Calcium: 9.5
GFR calc non Af Amer: >60
Glucose, Bld: 105 — ABNORMAL HIGH

## 2010-12-11 LAB — CBC
Platelets: 338
RDW: 13.3

## 2010-12-11 LAB — PREGNANCY, URINE: Preg Test, Ur: NEGATIVE

## 2010-12-11 LAB — RAPID URINE DRUG SCREEN, HOSP PERFORMED
Amphetamines: NOT DETECTED
Benzodiazepines: NOT DETECTED

## 2010-12-11 LAB — URINALYSIS, ROUTINE W REFLEX MICROSCOPIC
Nitrite: NEGATIVE
Protein, ur: NEGATIVE
Urobilinogen, UA: 0.2

## 2015-06-10 ENCOUNTER — Encounter (HOSPITAL_BASED_OUTPATIENT_CLINIC_OR_DEPARTMENT_OTHER): Payer: Self-pay | Admitting: Emergency Medicine

## 2015-06-10 ENCOUNTER — Emergency Department (HOSPITAL_BASED_OUTPATIENT_CLINIC_OR_DEPARTMENT_OTHER)
Admission: EM | Admit: 2015-06-10 | Discharge: 2015-06-11 | Disposition: A | Discharge status: Home / Self Care | Payer: BLUE CROSS/BLUE SHIELD | Attending: Emergency Medicine | Admitting: Emergency Medicine

## 2015-06-10 DIAGNOSIS — K219 Gastro-esophageal reflux disease without esophagitis: Secondary | ICD-10-CM | POA: Insufficient documentation

## 2015-06-10 DIAGNOSIS — Z79899 Other long term (current) drug therapy: Secondary | ICD-10-CM | POA: Insufficient documentation

## 2015-06-10 DIAGNOSIS — Y9289 Other specified places as the place of occurrence of the external cause: Secondary | ICD-10-CM | POA: Insufficient documentation

## 2015-06-10 DIAGNOSIS — Z23 Encounter for immunization: Secondary | ICD-10-CM | POA: Diagnosis not present

## 2015-06-10 DIAGNOSIS — Y9389 Activity, other specified: Secondary | ICD-10-CM | POA: Diagnosis not present

## 2015-06-10 DIAGNOSIS — W208XXA Other cause of strike by thrown, projected or falling object, initial encounter: Secondary | ICD-10-CM | POA: Diagnosis not present

## 2015-06-10 DIAGNOSIS — Z91040 Latex allergy status: Secondary | ICD-10-CM | POA: Insufficient documentation

## 2015-06-10 DIAGNOSIS — W25XXXA Contact with sharp glass, initial encounter: Secondary | ICD-10-CM | POA: Diagnosis not present

## 2015-06-10 DIAGNOSIS — G4700 Insomnia, unspecified: Secondary | ICD-10-CM | POA: Diagnosis not present

## 2015-06-10 DIAGNOSIS — S91312A Laceration without foreign body, left foot, initial encounter: Secondary | ICD-10-CM | POA: Diagnosis not present

## 2015-06-10 DIAGNOSIS — S96922A Laceration of unspecified muscle and tendon at ankle and foot level, left foot, initial encounter: Secondary | ICD-10-CM | POA: Insufficient documentation

## 2015-06-10 DIAGNOSIS — Y998 Other external cause status: Secondary | ICD-10-CM | POA: Diagnosis not present

## 2015-06-10 DIAGNOSIS — IMO0002 Reserved for concepts with insufficient information to code with codable children: Secondary | ICD-10-CM

## 2015-06-10 DIAGNOSIS — S99922A Unspecified injury of left foot, initial encounter: Secondary | ICD-10-CM | POA: Diagnosis present

## 2015-06-10 DIAGNOSIS — Z880 Allergy status to penicillin: Secondary | ICD-10-CM | POA: Diagnosis not present

## 2015-06-10 HISTORY — DX: Insomnia, unspecified: G47.00

## 2015-06-10 HISTORY — DX: Gastro-esophageal reflux disease without esophagitis: K21.9

## 2015-06-10 MED ORDER — TETANUS-DIPHTH-ACELL PERTUSSIS 5-2.5-18.5 LF-MCG/0.5 IM SUSP
0.5000 mL | Freq: Once | INTRAMUSCULAR | Status: AC
  Administered 2015-06-11: 0.5 mL via INTRAMUSCULAR
  Filled 2015-06-10: qty 0.5

## 2015-06-10 MED ORDER — LIDOCAINE-EPINEPHRINE-TETRACAINE (LET) SOLUTION
3.0000 mL | Freq: Once | NASAL | Status: AC
  Administered 2015-06-11: 3 mL via TOPICAL
  Filled 2015-06-10: qty 3

## 2015-06-10 MED ORDER — LIDOCAINE HCL (PF) 1 % IJ SOLN
5.0000 mL | Freq: Once | INTRAMUSCULAR | Status: AC
  Administered 2015-06-11: 5 mL via INTRADERMAL
  Filled 2015-06-10: qty 5

## 2015-06-10 MED ORDER — HYDROCODONE-ACETAMINOPHEN 5-325 MG PO TABS
1.0000 | ORAL_TABLET | Freq: Once | ORAL | Status: AC
  Administered 2015-06-11: 1 via ORAL
  Filled 2015-06-10: qty 1

## 2015-06-10 NOTE — ED Provider Notes (Signed)
CSN: [id number]     Arrival date & time 06/10/15  2112 History   First MD Initiated Contact with Patient 06/10/15 2156     Chief Complaint  Patient presents with  . Extremity Laceration     (Consider location/radiation/quality/duration/timing/severity/associated sxs/prior Treatment) HPI  Blood pressure 112/88, pulse 74, temperature 98 F (36.7 C), temperature source Oral, resp. rate 20, last menstrual period 05/16/2015, SpO2 99 %.  Misty Webb is a 35 y.o. female complaining of laceration to left foot occurring several hours prior to arrival when patient was lifting a piece of broken glass fell directly onto the top of the foot. She was wearing shoes at the time. States that she cannot move her fourth digit. States that the pain is mild, bleeding is controlled, last tetanus shot was over 10 years ago. States that the glass didn't break.  Past Medical History  Diagnosis Date  . Insomnia   . GERD (gastroesophageal reflux disease)    History reviewed. No pertinent past surgical history. History reviewed. No pertinent family history. Social History  Substance Use Topics  . Smoking status: Never Smoker   . Smokeless tobacco: None  . Alcohol Use: Yes     Comment: rarely   OB History    No data available     Review of Systems  10 systems reviewed and found to be negative, except as noted in the HPI.   Allergies  Latex and Penicillins  Home Medications   Prior to Admission medications   Medication Sig Start Date End Date Taking? Authorizing Provider  omeprazole (PRILOSEC) 20 MG capsule Take 20 mg by mouth daily.   Yes Historical Provider, MD  zolpidem (AMBIEN) 10 MG tablet Take 10 mg by mouth at bedtime as needed for sleep.   Yes Historical Provider, MD  HYDROcodone-acetaminophen (NORCO/VICODIN) 5-325 MG tablet Take 1-2 tablets by mouth every 6 hours as needed for pain and/or cough. 06/11/15   Gaila Engebretsen, PA-C   BP 98/69 mmHg  Pulse 75  Temp(Src) 98 F (36.7 C)  (Oral)  Resp 18  SpO2 100%  LMP 05/16/2015 Physical Exam  Constitutional: She is oriented to person, place, and time. She appears well-developed and well-nourished. No distress.  HENT:  Head: Normocephalic.  Eyes: Conjunctivae and EOM are normal.  Cardiovascular: Normal rate.   Pulmonary/Chest: Effort normal. No stridor.  Musculoskeletal: Normal range of motion.       Feet:  2 cm flap-like laceration as diagrammed, bleeding is controlled. Patient has weakness in dorsiflexion of the left fourth digit, she is distally neurovascularly intact and able to differentiate pinprick from light touch with brisk cap refill.    Neurological: She is alert and oriented to person, place, and time.  Psychiatric: She has a normal mood and affect.  Nursing note and vitals reviewed.   ED Course  .Marland KitchenLaceration Repair Date/Time: 06/11/2015 12:45 AM Performed by: Wynetta Emery Authorized by: Wynetta Emery Consent: Verbal consent obtained. Risks and benefits: risks, benefits and alternatives were discussed Consent given by: patient Patient identity confirmed: verbally with patient Body area: lower extremity Location details: left foot Laceration length: 2 cm Foreign bodies: no foreign bodies Tendon involvement: complex (Full transection of extensor digitorum longus on the fourth digit) Nerve involvement: none Vascular damage: no Anesthesia: local infiltration Local anesthetic: lidocaine 1% without epinephrine Anesthetic total: 3 ml Patient sedated: no Preparation: Patient was prepped and draped in the usual sterile fashion. Irrigation solution: saline Irrigation method: syringe Amount of cleaning: extensive Debridement: none Degree of  undermining: none Skin closure: Ethilon (4-0) Wound tendon closure material used: Distal aspect of tendon is not located. Number of sutures: 1 corner suture, 4 simple interrupted. Approximation: close Approximation difficulty: complex Dressing:  antibiotic ointment Patient tolerance: Patient tolerated the procedure well with no immediate complications    SPLINT APPLICATION Date/Time: 1:34 AM Authorized by: Wynetta EmeryPISCIOTTA, Sagrario Lineberry Consent: Verbal consent obtained. Risks and benefits: risks, benefits and alternatives were discussed Consent given by: patient Splint applied by: EMT Location details: left LLE Splint type: Posterior short leg  Supplies used: Ortho-Glass  Post-procedure: The splinted body part was neurovascularly unchanged following the procedure. Patient tolerance: Patient tolerated the procedure well with no immediate complications.    Labs Review Labs Reviewed - No data to display  Imaging Review No results found. I have personally reviewed and evaluated these images and lab results as part of my medical decision-making.   EKG Interpretation None      MDM   Final diagnoses:  Foot laceration, left, initial encounter  Tendon laceration   Filed Vitals:   06/10/15 2118 06/10/15 2326  BP: 112/88 98/69  Pulse: 74 75  Temp: 98 F (36.7 C)   TempSrc: Oral   Resp: 20 18  SpO2: 99% 100%    Medications  lidocaine-EPINEPHrine-tetracaine (LET) solution (3 mLs Topical Given 06/11/15 0004)  lidocaine (PF) (XYLOCAINE) 1 % injection 5 mL (5 mLs Intradermal Given 06/11/15 0007)  HYDROcodone-acetaminophen (NORCO/VICODIN) 5-325 MG per tablet 1 tablet (1 tablet Oral Given 06/11/15 0004)  Tdap (BOOSTRIX) injection 0.5 mL (0.5 mLs Intramuscular Given 06/11/15 0008)    Misty Webb is 35 y.o. female presenting with Laceration to dorsum of left foot. Patient has complete transection on the fourth extensor tendon, otherwise neurovascularly intact. Wound is cleaned and closed, case discussed with orthopedist Dr. Sherlean FootLucey who will see her in the office tomorrow.  This is a shared visit with the attending physician who personally evaluated the patient and agrees with the care plan.   Evaluation does not show pathology that would  require ongoing emergent intervention or inpatient treatment. Pt is hemodynamically stable and mentating appropriately. Discussed findings and plan with patient/guardian, who agrees with care plan. All questions answered. Return precautions discussed and outpatient follow up given.   New Prescriptions   HYDROCODONE-ACETAMINOPHEN (NORCO/VICODIN) 5-325 MG TABLET    Take 1-2 tablets by mouth every 6 hours as needed for pain and/or cough.        Wynetta Emeryicole Kharizma Lesnick, PA-C 06/11/15 0134  Shon Batonourtney F Horton, MD 06/11/15 [id number]

## 2015-06-10 NOTE — ED Notes (Signed)
Patient cut her left foot on a piece of glass. The patient is having some numbness to her left 4th toes and it is dropped.

## 2015-06-10 NOTE — ED Notes (Signed)
Skin flap lac to top of left foot w a piece of [place]

## 2015-06-11 MED ORDER — HYDROCODONE-ACETAMINOPHEN 5-325 MG PO TABS: ORAL_TABLET | ORAL | Status: DC

## 2015-06-11 NOTE — Discharge Instructions (Signed)
Call orthopedist first thing in the morning to make an appointment for tomorrow afternoon.  Rest, Ice intermittently (in the first 24-48 hours), Gentle compression with an Ace wrap, and elevate (Limb above the level of the heart)   Take up to 800mg  of ibuprofen (that is usually 4 over the counter pills)  3 times a day for 5 days. Take with food.  Take vicodin for breakthrough pain, do not drink alcohol, drive, care for children or do other critical tasks while taking vicodin.

## 2015-06-12 ENCOUNTER — Encounter (HOSPITAL_COMMUNITY): Payer: Self-pay | Admitting: *Deleted

## 2015-06-12 ENCOUNTER — Other Ambulatory Visit (HOSPITAL_COMMUNITY): Payer: Self-pay | Admitting: Family

## 2015-06-12 NOTE — Progress Notes (Addendum)
PCP is Dr Gillermina Huichard Escajed in [place]. I requested records from his office.  Patient reports that she had a history  Of palpations and had a cardiac work up years ago, probably more than 8 (not sure where). The cardiac work up was normal and palpations were thought to be due to anxiety.  No recent palpations.

## 2015-06-13 ENCOUNTER — Ambulatory Visit (HOSPITAL_COMMUNITY)
Admission: RE | Admit: 2015-06-13 | Discharge: 2015-06-13 | Disposition: A | Discharge status: Home / Self Care | Payer: BLUE CROSS/BLUE SHIELD | Source: Ambulatory Visit | Attending: Orthopedic Surgery | Admitting: Orthopedic Surgery

## 2015-06-13 ENCOUNTER — Encounter (HOSPITAL_COMMUNITY)
Admission: RE | Disposition: A | Discharge status: Home / Self Care | Payer: Self-pay | Source: Ambulatory Visit | Attending: Orthopedic Surgery

## 2015-06-13 ENCOUNTER — Ambulatory Visit (HOSPITAL_COMMUNITY): Payer: BLUE CROSS/BLUE SHIELD | Admitting: Certified Registered Nurse Anesthetist

## 2015-06-13 ENCOUNTER — Encounter (HOSPITAL_COMMUNITY): Payer: Self-pay | Admitting: *Deleted

## 2015-06-13 DIAGNOSIS — S96922A Laceration of unspecified muscle and tendon at ankle and foot level, left foot, initial encounter: Secondary | ICD-10-CM

## 2015-06-13 DIAGNOSIS — W25XXXA Contact with sharp glass, initial encounter: Secondary | ICD-10-CM | POA: Insufficient documentation

## 2015-06-13 DIAGNOSIS — S91312A Laceration without foreign body, left foot, initial encounter: Secondary | ICD-10-CM | POA: Insufficient documentation

## 2015-06-13 HISTORY — PX: REPAIR EXTENSOR TENDON: SHX5382

## 2015-06-13 HISTORY — DX: Pneumonia, unspecified organism: J18.9

## 2015-06-13 HISTORY — DX: Urinary tract infection, site not specified: N39.0

## 2015-06-13 HISTORY — DX: Other seasonal allergic rhinitis: J30.2

## 2015-06-13 HISTORY — DX: Other complications of anesthesia, initial encounter: T88.59XA

## 2015-06-13 HISTORY — DX: Adverse effect of unspecified anesthetic, initial encounter: T41.45XA

## 2015-06-13 HISTORY — DX: Family history of other specified conditions: Z84.89

## 2015-06-13 LAB — CBC
HEMATOCRIT: 41.3 % (ref 36.0–46.0)
HEMOGLOBIN: 13.3 g/dL (ref 12.0–15.0)
MCH: 29.2 pg (ref 26.0–34.0)
MCHC: 32.2 g/dL (ref 30.0–36.0)
MCV: 90.8 fL (ref 78.0–100.0)
Platelets: 263 10*3/uL (ref 150–400)
RBC: 4.55 MIL/uL (ref 3.87–5.11)
RDW: 12 % (ref 11.5–15.5)
WBC: 6.3 10*3/uL (ref 4.0–10.5)

## 2015-06-13 LAB — HCG, SERUM, QUALITATIVE: Preg, Serum: NEGATIVE

## 2015-06-13 SURGERY — REPAIR, TENDON, EXTENSOR
Anesthesia: General | Laterality: Left

## 2015-06-13 MED ORDER — 0.9 % SODIUM CHLORIDE (POUR BTL) OPTIME
TOPICAL | Status: DC | PRN
  Administered 2015-06-13: 1000 mL

## 2015-06-13 MED ORDER — ONDANSETRON HCL 4 MG/2ML IJ SOLN
INTRAMUSCULAR | Status: DC | PRN
  Administered 2015-06-13: 4 mg via INTRAVENOUS

## 2015-06-13 MED ORDER — OXYCODONE HCL 5 MG PO TABS
ORAL_TABLET | ORAL | Status: AC
  Filled 2015-06-13: qty 1

## 2015-06-13 MED ORDER — PROPOFOL 10 MG/ML IV BOLUS
INTRAVENOUS | Status: DC | PRN
  Administered 2015-06-13: 200 mg via INTRAVENOUS

## 2015-06-13 MED ORDER — OXYCODONE HCL 5 MG/5ML PO SOLN: 5.0000 mg | Freq: Once | ORAL | Status: AC | PRN

## 2015-06-13 MED ORDER — HYDROMORPHONE HCL 1 MG/ML IJ SOLN
INTRAMUSCULAR | Status: AC
  Filled 2015-06-13: qty 1

## 2015-06-13 MED ORDER — CLINDAMYCIN PHOSPHATE 900 MG/50ML IV SOLN
INTRAVENOUS | Status: AC
  Administered 2015-06-13: 900 mg via INTRAVENOUS
  Filled 2015-06-13: qty 50

## 2015-06-13 MED ORDER — TRAMADOL HCL 50 MG PO TABS: 50.0000 mg | ORAL_TABLET | Freq: Four times a day (QID) | ORAL | Status: DC | PRN

## 2015-06-13 MED ORDER — DEXAMETHASONE SODIUM PHOSPHATE 4 MG/ML IJ SOLN
INTRAMUSCULAR | Status: DC | PRN
  Administered 2015-06-13: 4 mg via INTRAVENOUS

## 2015-06-13 MED ORDER — PROPOFOL 10 MG/ML IV BOLUS
INTRAVENOUS | Status: AC
  Filled 2015-06-13: qty 40

## 2015-06-13 MED ORDER — CLINDAMYCIN PHOSPHATE 900 MG/50ML IV SOLN: 900.0000 mg | INTRAVENOUS | Status: DC

## 2015-06-13 MED ORDER — HYDROMORPHONE HCL 1 MG/ML IJ SOLN
0.2500 mg | INTRAMUSCULAR | Status: DC | PRN
  Administered 2015-06-13 (×2): 0.5 mg via INTRAVENOUS
  Administered 2015-06-13: 1 mg via INTRAVENOUS

## 2015-06-13 MED ORDER — LIDOCAINE HCL (CARDIAC) 20 MG/ML IV SOLN
INTRAVENOUS | Status: DC | PRN
  Administered 2015-06-13: 60 mg via INTRAVENOUS

## 2015-06-13 MED ORDER — MIDAZOLAM HCL 5 MG/5ML IJ SOLN
INTRAMUSCULAR | Status: DC | PRN
  Administered 2015-06-13: 2 mg via INTRAVENOUS

## 2015-06-13 MED ORDER — LACTATED RINGERS IV SOLN
INTRAVENOUS | Status: DC
  Administered 2015-06-13 (×2): via INTRAVENOUS

## 2015-06-13 MED ORDER — MEPERIDINE HCL 25 MG/ML IJ SOLN: 6.2500 mg | INTRAMUSCULAR | Status: DC | PRN

## 2015-06-13 MED ORDER — FENTANYL CITRATE (PF) 100 MCG/2ML IJ SOLN
INTRAMUSCULAR | Status: DC | PRN
  Administered 2015-06-13: 100 ug via INTRAVENOUS

## 2015-06-13 MED ORDER — OXYCODONE HCL 5 MG PO TABS
5.0000 mg | ORAL_TABLET | Freq: Once | ORAL | Status: AC | PRN
  Administered 2015-06-13: 5 mg via ORAL

## 2015-06-13 SURGICAL SUPPLY — 45 items
BLADE SURG 10 STRL SS (BLADE) ×3 IMPLANT
BNDG COHESIVE 6X5 TAN STRL LF (GAUZE/BANDAGES/DRESSINGS) ×3 IMPLANT
BNDG ESMARK 4X9 LF (GAUZE/BANDAGES/DRESSINGS) IMPLANT
BNDG GAUZE ELAST 4 BULKY (GAUZE/BANDAGES/DRESSINGS) ×3 IMPLANT
BNDG GAUZE STRTCH 6 (GAUZE/BANDAGES/DRESSINGS) IMPLANT
COTTON STERILE ROLL (GAUZE/BANDAGES/DRESSINGS) IMPLANT
COVER SURGICAL LIGHT HANDLE (MISCELLANEOUS) ×3 IMPLANT
CUFF TOURNIQUET SINGLE 34IN LL (TOURNIQUET CUFF) IMPLANT
CUFF TOURNIQUET SINGLE 44IN (TOURNIQUET CUFF) IMPLANT
DRAPE INCISE IOBAN 66X45 STRL (DRAPES) ×3 IMPLANT
DRAPE U-SHAPE 47X51 STRL (DRAPES) ×3 IMPLANT
DRSG ADAPTIC 3X8 NADH LF (GAUZE/BANDAGES/DRESSINGS) ×3 IMPLANT
DRSG PAD ABDOMINAL 8X10 ST (GAUZE/BANDAGES/DRESSINGS) ×6 IMPLANT
DURAPREP 26ML APPLICATOR (WOUND CARE) ×3 IMPLANT
ELECT REM PT RETURN 9FT ADLT (ELECTROSURGICAL) ×3
ELECTRODE REM PT RTRN 9FT ADLT (ELECTROSURGICAL) ×1 IMPLANT
GAUZE SPONGE 4X4 12PLY STRL (GAUZE/BANDAGES/DRESSINGS) ×3 IMPLANT
GLOVE BIOGEL PI IND STRL 9 (GLOVE) ×1 IMPLANT
GLOVE BIOGEL PI INDICATOR 9 (GLOVE) ×2
GLOVE SURG ORTHO 9.0 STRL STRW (GLOVE) ×3 IMPLANT
GOWN STRL REUS W/ TWL XL LVL3 (GOWN DISPOSABLE) ×3 IMPLANT
GOWN STRL REUS W/TWL XL LVL3 (GOWN DISPOSABLE) ×6
KIT ROOM TURNOVER OR (KITS) ×3 IMPLANT
MANIFOLD NEPTUNE II (INSTRUMENTS) IMPLANT
NDL SUT .5 MAYO 1.404X.05X (NEEDLE) ×1 IMPLANT
NEEDLE MAYO TAPER (NEEDLE) ×2
NS IRRIG 1000ML POUR BTL (IV SOLUTION) ×3 IMPLANT
PACK ORTHO EXTREMITY (CUSTOM PROCEDURE TRAY) ×3 IMPLANT
PAD ARMBOARD 7.5X6 YLW CONV (MISCELLANEOUS) ×6 IMPLANT
SPONGE GAUZE 4X4 12PLY STER LF (GAUZE/BANDAGES/DRESSINGS) ×3 IMPLANT
SPONGE LAP 4X18 X RAY DECT (DISPOSABLE) IMPLANT
SUT ETHILON 3 0 FSLX (SUTURE) ×3 IMPLANT
SUT FIBERWIRE #2 38 T-5 BLUE (SUTURE)
SUT FIBERWIRE 2-0 18 17.9 3/8 (SUTURE) ×3
SUT FIBERWIRE 3-0 18 DIAM 3/8 (SUTURE) ×3
SUT MNCRL AB 3-0 PS2 18 (SUTURE) IMPLANT
SUTURE FIBERWR #2 38 T-5 BLUE (SUTURE) IMPLANT
SUTURE FIBERWR 2-0 18 17.9 3/8 (SUTURE) ×1 IMPLANT
SUTURE FIBERWR 3-0 18 DIAM 3/8 (SUTURE) ×1 IMPLANT
TOWEL OR 17X24 6PK STRL BLUE (TOWEL DISPOSABLE) ×3 IMPLANT
TOWEL OR 17X26 10 PK STRL BLUE (TOWEL DISPOSABLE) ×3 IMPLANT
TUBE CONNECTING 12'X1/4 (SUCTIONS) ×1
TUBE CONNECTING 12X1/4 (SUCTIONS) ×2 IMPLANT
WATER STERILE IRR 1000ML POUR (IV SOLUTION) ×3 IMPLANT
YANKAUER SUCT BULB TIP NO VENT (SUCTIONS) ×3 IMPLANT

## 2015-06-13 NOTE — Anesthesia Preprocedure Evaluation (Signed)
Anesthesia Evaluation  Patient identified by MRN, date of birth, ID band Patient awake    Reviewed: Allergy & Precautions, NPO status , Patient's Chart, lab work & pertinent test results  Airway Mallampati: I  TM Distance: >3 FB Neck ROM: Full    Dental  (+) Teeth Intact, Dental Advisory Given   Pulmonary  breath sounds clear to auscultation        Cardiovascular Rhythm:Regular Rate:Normal     Neuro/Psych    GI/Hepatic GERD-  Medicated and Controlled,  Endo/Other    Renal/GU      Musculoskeletal   Abdominal   Peds  Hematology   Anesthesia Other Findings   Reproductive/Obstetrics                             Anesthesia Physical Anesthesia Plan  ASA: I  Anesthesia Plan: General   Post-op Pain Management:    Induction: Intravenous  Airway Management Planned: LMA  Additional Equipment:   Intra-op Plan:   Post-operative Plan: Extubation in OR  Informed Consent: I have reviewed the patients History and Physical, chart, labs and discussed the procedure including the risks, benefits and alternatives for the proposed anesthesia with the patient or authorized representative who has indicated his/her understanding and acceptance.   Dental advisory given  Plan Discussed with: CRNA, Anesthesiologist and Surgeon  Anesthesia Plan Comments:         Anesthesia Quick Evaluation  

## 2015-06-13 NOTE — Anesthesia Procedure Notes (Signed)
Procedure Name: LMA Insertion Date/Time: 06/13/2015 6:03 PM Performed by: Reine JustFLOWERS, Anushri Casalino T Pre-anesthesia Checklist: Patient identified, Emergency Drugs available, Suction available, Patient being monitored and Timeout performed Patient Re-evaluated:Patient Re-evaluated prior to inductionOxygen Delivery Method: Circle system utilized and Simple face mask Preoxygenation: Pre-oxygenation with 100% oxygen Intubation Type: IV induction Ventilation: Mask ventilation without difficulty LMA: LMA inserted LMA Size: 4.0 Number of attempts: 2 Airway Equipment and Method: Patient positioned with wedge pillow Placement Confirmation: positive ETCO2 and breath sounds checked- equal and bilateral Tube secured with: Tape Dental Injury: Teeth and Oropharynx as per pre-operative assessment

## 2015-06-13 NOTE — Op Note (Signed)
06/13/2015  6:26 PM  PATIENT:  Misty Webb    PRE-OPERATIVE DIAGNOSIS:  Laceration Left Foot 4th Extensor Tendon  local tissue rearrangement for wound closure 4 x 1 cm  POST-OPERATIVE DIAGNOSIS:  Same  PROCEDURE:  Reconstruction Extensor Tendon Left Foot  SURGEON:  Nadara MustardUDA,Aubriana Ravelo V, MD  PHYSICIAN ASSISTANT:None ANESTHESIA:   General  PREOPERATIVE INDICATIONS:  Misty Webb is a  35 y.o. female with a diagnosis of Laceration Left Foot 4th Extensor Tendon who failed conservative measures and elected for surgical management.    The risks benefits and alternatives were discussed with the patient preoperatively including but not limited to the risks of infection, bleeding, nerve injury, cardiopulmonary complications, the need for revision surgery, among others, and the patient was willing to proceed.  OPERATIVE IMPLANTS: None  OPERATIVE FINDINGS:  isolated tendon laceration fourth toe extensor tendon with very small neck to the fifth toe extensor tendon.  OPERATIVE PROCEDURE: Patient was ought the operating room and underwent a general anesthetic. After adequate levels anesthesia were obtained patient's left lower extremity was prepped using DuraPrep draped into a sterile field a timeout was called. Patient had a skiving type laceration the nonviable tissue was removed which left a wound 4 x 1 cm. Examination showed that the retinaculum over the third and fourth toes was intact and was isolated complete laceration of the fourth toe extensor tendon. There was no nerve branch of the superficial peroneal and the traumatic wound. Using a Kessler technique and #2-0 FiberWire was woven through the tendon twice with a total of 4 strands going across the wound. Patient had a stable construct. Local tissue rearrangement was used for wound closure of the wound 4 x 1 cm. The wound was covered with a sterile compressive dressing patient was extubated taken the PACU in stable condition plan for discharge to  home prescription for Ultram for pain touchdown weightbearing in a fracture boot.

## 2015-06-13 NOTE — Transfer of Care (Signed)
Immediate Anesthesia Transfer of Care Note  Patient: Misty Webb  Procedure(s) Performed: Procedure(s): Reconstruction Extensor Tendon Left Foot (Left)  Patient Location: PACU  Anesthesia Type:General  Level of Consciousness: awake, alert  and oriented  Airway & Oxygen Therapy: Patient Spontanous Breathing and Patient connected to nasal cannula oxygen  Post-op Assessment: Report given to RN, Post -op Vital signs reviewed and stable and Patient moving all extremities X 4  Post vital signs: Reviewed and stable  Last Vitals:  Filed Vitals:   06/13/15 1317  BP: 92/60  Pulse: 63  Temp: 36.7 C  Resp: 18    Complications: No apparent anesthesia complications

## 2015-06-13 NOTE — Anesthesia Postprocedure Evaluation (Signed)
Anesthesia Post Note  Patient: Misty Webb  Procedure(s) Performed: Procedure(s) (LRB): Reconstruction Extensor Tendon Left Foot (Left)  Patient location during evaluation: PACU Anesthesia Type: General Level of consciousness: awake Pain management: pain level controlled Vital Signs Assessment: post-procedure vital signs reviewed and stable Respiratory status: spontaneous breathing Cardiovascular status: stable Anesthetic complications: no    Last Vitals:  Filed Vitals:   06/13/15 1317 06/13/15 1842  BP: 92/60   Pulse: 63   Temp: 36.7 C 36.6 C  Resp: 18     Last Pain:  Filed Vitals:   06/13/15 1853  PainSc: 5                  EDWARDS,Corianne Buccellato

## 2015-06-13 NOTE — H&P (Signed)
Misty Webb is an 35 y.o. female.   Chief Complaint: Extensor tendon laceration left foot fourth toe HPI: Patient is a 35 year old woman who dropped a piece of glass on her foot sustaining a complete laceration to the extensor tendon fourth toe and possible laceration to the fifth toe extensor tendon.  Past Medical History  Diagnosis Date  . Insomnia   . GERD (gastroesophageal reflux disease)   . Family history of adverse reaction to anesthesia     MOTHER NAUSEA.  FATHER - difficulty waking up  . Seasonal allergies   . Pneumonia     in her 120s  . UTI (urinary tract infection)     approx- 1 a year  . Complication of anesthesia     Woke up during endo and tried to pull tube out.  "Easily nauseated"    Past Surgical History  Procedure Laterality Date  . Upper gastrointestinal endoscopy      History reviewed. No pertinent family history. Social History:  reports that she has never smoked. She does not have any smokeless tobacco history on file. She reports that she does not drink alcohol or use illicit drugs.  Allergies:  Allergies  Allergen Reactions  . Latex Swelling  . Penicillins Hives    No prescriptions prior to admission    No results found for this or any previous visit (from the past 48 hour(s)). No results found.  Review of Systems  All other systems reviewed and are negative.   Height 5' 4.5" (1.638 m), weight 50.803 kg (112 lb), last menstrual period 05/16/2015. Physical Exam  On examination patient has no active extension of the fourth toe left foot. She does have numbness dorsally on her foot and may also sustained lacerations to the superficial peroneal nerve. Assessment/Plan Assessment: Extensor tendon and nerve laceration dorsum of the left foot.  Plan: We'll plan for extensor tendon Reconstruction possible nerve reconstruction. Risk and benefits were discussed including loss of extension of the fourth toe numbness on the dorsum of her foot. Patient  states she understands wish to proceed at this time.  Nadara MustardUDA,Marca Gadsby V, MD 06/13/2015, 6:47 AM

## 2015-06-13 NOTE — Progress Notes (Signed)
OOB and moving, fels lightheaded/some nausea. Intermittent. Admits has "problems with motion sickness". After 2 felt like being d/c'd.

## 2015-06-16 ENCOUNTER — Emergency Department (HOSPITAL_COMMUNITY)
Admission: EM | Admit: 2015-06-16 | Discharge: 2015-06-16 | Disposition: A | Discharge status: Home / Self Care | Payer: BLUE CROSS/BLUE SHIELD | Attending: Emergency Medicine | Admitting: Emergency Medicine

## 2015-06-16 ENCOUNTER — Encounter (HOSPITAL_COMMUNITY): Payer: Self-pay | Admitting: Emergency Medicine

## 2015-06-16 DIAGNOSIS — G43009 Migraine without aura, not intractable, without status migrainosus: Secondary | ICD-10-CM

## 2015-06-16 DIAGNOSIS — G4700 Insomnia, unspecified: Secondary | ICD-10-CM | POA: Diagnosis not present

## 2015-06-16 DIAGNOSIS — Z880 Allergy status to penicillin: Secondary | ICD-10-CM | POA: Diagnosis not present

## 2015-06-16 DIAGNOSIS — G43909 Migraine, unspecified, not intractable, without status migrainosus: Secondary | ICD-10-CM | POA: Insufficient documentation

## 2015-06-16 DIAGNOSIS — R51 Headache: Secondary | ICD-10-CM | POA: Diagnosis present

## 2015-06-16 DIAGNOSIS — Z87440 Personal history of urinary (tract) infections: Secondary | ICD-10-CM | POA: Diagnosis not present

## 2015-06-16 DIAGNOSIS — Z79899 Other long term (current) drug therapy: Secondary | ICD-10-CM | POA: Diagnosis not present

## 2015-06-16 DIAGNOSIS — Z8701 Personal history of pneumonia (recurrent): Secondary | ICD-10-CM | POA: Diagnosis not present

## 2015-06-16 DIAGNOSIS — Z91040 Latex allergy status: Secondary | ICD-10-CM | POA: Diagnosis not present

## 2015-06-16 DIAGNOSIS — K219 Gastro-esophageal reflux disease without esophagitis: Secondary | ICD-10-CM | POA: Diagnosis not present

## 2015-06-16 MED ORDER — ONDANSETRON 4 MG PO TBDP
ORAL_TABLET | ORAL | Status: AC
  Filled 2015-06-16: qty 1

## 2015-06-16 MED ORDER — IBUPROFEN 800 MG PO TABS
800.0000 mg | ORAL_TABLET | Freq: Once | ORAL | Status: AC
  Administered 2015-06-16: 800 mg via ORAL
  Filled 2015-06-16: qty 1

## 2015-06-16 MED ORDER — DIPHENHYDRAMINE HCL 25 MG PO CAPS
50.0000 mg | ORAL_CAPSULE | Freq: Once | ORAL | Status: AC
Start: 2015-06-16 — End: 2015-06-16
  Administered 2015-06-16: 50 mg via ORAL
  Filled 2015-06-16: qty 2

## 2015-06-16 MED ORDER — METOCLOPRAMIDE HCL 10 MG PO TABS
10.0000 mg | ORAL_TABLET | Freq: Once | ORAL | Status: AC
Start: 2015-06-16 — End: 2015-06-16
  Administered 2015-06-16: 10 mg via ORAL
  Filled 2015-06-16: qty 1

## 2015-06-16 MED ORDER — ONDANSETRON 4 MG PO TBDP
4.0000 mg | ORAL_TABLET | Freq: Once | ORAL | Status: AC | PRN
  Administered 2015-06-16: 4 mg via ORAL

## 2015-06-16 MED ORDER — IBUPROFEN 800 MG PO TABS: 800.0000 mg | ORAL_TABLET | Freq: Three times a day (TID) | ORAL | Status: AC | PRN

## 2015-06-16 NOTE — ED Provider Notes (Signed)
CSN: [id number]     Arrival date & time 06/16/15  1400 History   First MD Initiated Contact with Patient 06/16/15 1804     Chief Complaint  Patient presents with  . Headache  . Nausea     (Consider location/radiation/quality/duration/timing/severity/associated sxs/prior Treatment) HPI  Ms. Misty Webb is a 35 year old female with PMH of Anxiety who presents with nausea and headache. Patient recently (06/13/2015) underwent surgical reconstruction of the extensor tendon of the left foot after she dropped glass lacerating her fourth toe extensor tendon. Patient states that since her surgery she has had intermittent nausea and headaches. She says her headache worsened this morning, located at her temples, across her forehead, and posterior neck. She had associated light and auditory sensitivity. She has been taking Norco and Tramadol for her post-op pain and says her headaches seem to occur after she takes these. She tried Zyrtec-D this morning. She has a family history of migraines in her sister and mother. Patient states that her headache as improved some since it began.   Past Medical History  Diagnosis Date  . Insomnia   . GERD (gastroesophageal reflux disease)   . Family history of adverse reaction to anesthesia     MOTHER NAUSEA.  FATHER - difficulty waking up  . Seasonal allergies   . Pneumonia     in her 6020s  . UTI (urinary tract infection)     approx- 1 a year  . Complication of anesthesia     Woke up during endo and tried to pull tube out.  "Easily nauseated"   Past Surgical History  Procedure Laterality Date  . Upper gastrointestinal endoscopy     History reviewed. No pertinent family history. Social History  Substance Use Topics  . Smoking status: Never Smoker   . Smokeless tobacco: None  . Alcohol Use: No     Comment: rarely   OB History    No data available     Review of Systems  Constitutional: Negative for fever, chills, diaphoresis and appetite change.   Respiratory: Negative for shortness of breath.   Cardiovascular: Negative for chest pain.  Gastrointestinal: Positive for nausea. Negative for vomiting and abdominal pain.  Genitourinary: Negative for dysuria.  Neurological: Positive for headaches. Negative for dizziness, weakness, light-headedness and numbness.      Allergies  Latex and Penicillins  Home Medications   Prior to Admission medications   Medication Sig Start Date End Date Taking? Authorizing Provider  Biotin w/ Vitamins C & E (HAIR/SKIN/NAILS PO) Take 1 tablet by mouth daily.   Yes Historical Provider, MD  cetirizine-pseudoephedrine (ZYRTEC-D) 5-120 MG tablet Take 1 tablet by mouth daily.    Yes Historical Provider, MD  clonazePAM (KLONOPIN) 1 MG tablet Take 0.25-0.5 mg by mouth daily as needed for anxiety.   Yes Historical Provider, MD  HYDROcodone-acetaminophen (NORCO/VICODIN) 5-325 MG tablet Take 1-2 tablets by mouth every 6 hours as needed for pain and/or cough. 06/11/15  Yes Nicole Pisciotta, PA-C  simethicone (MYLICON) 80 MG chewable tablet Chew 80 mg by mouth every 6 (six) hours as needed for flatulence.   Yes Historical Provider, MD  traMADol (ULTRAM) 50 MG tablet Take 1 tablet (50 mg total) by mouth every 6 (six) hours as needed. Maximum dose= 8 tablets per day 06/13/15  Yes Nadara MustardMarcus Duda V, MD  zolpidem (AMBIEN) 10 MG tablet Take 10 mg by mouth at bedtime as needed for sleep.   Yes Historical Provider, MD  ibuprofen (ADVIL,MOTRIN) 800 MG tablet  Take 1 tablet (800 mg total) by mouth every 8 (eight) hours as needed for headache. 06/16/15   Darreld Mclean, MD  omeprazole (PRILOSEC) 20 MG capsule Take 20 mg by mouth daily. Reported on 06/16/2015    Historical Provider, MD   BP 92/62 mmHg  Pulse 82  Temp(Src) 98.2 F (36.8 C) (Oral)  Resp 16  SpO2 98%  LMP 05/16/2015 Physical Exam  Constitutional: She is oriented to person, place, and time. She appears well-developed and well-nourished. No distress.  HENT:  Head:  Normocephalic and atraumatic.  Mouth/Throat: Oropharynx is clear and moist.  Eyes: EOM are normal. Pupils are equal, round, and reactive to light.  Neck: Normal range of motion. Neck supple.  Cardiovascular: Normal rate and regular rhythm.   Pulmonary/Chest: Effort normal. No respiratory distress. She has no wheezes. She has no rales.  Abdominal: Soft. There is no tenderness.  Musculoskeletal: Normal range of motion. She exhibits no edema or tenderness.  Lymphadenopathy:    She has no cervical adenopathy.  Neurological: She is alert and oriented to person, place, and time.  Skin: She is not diaphoretic.    ED Course  Procedures (including critical care time) Labs Review Labs Reviewed - No data to display  Imaging Review No results found. I have personally reviewed and evaluated these images and lab results as part of my medical decision-making.   EKG Interpretation None      MDM   Final diagnoses:  Nonintractable migraine, unspecified migraine type    35 year old female with PMH of Anxiety who presents with nausea and headache.  She reports normal monthly menstrual cycles. She complains of a bitemporal headache that moves across her forehead and at her posterior neck. She has associated photophobia and auditory sensitivity. Vital signs are stable and hCG on 06/13/2015 was normal. Her headache is likely a migraine or tension-type stress headache. She may also have some rebound headache from her Norco and Tramadol use.   Patient given Ibuprofen, Benadry, and Reglan in the ED. She is given prescription for Ibuprofen 800 mg as needed for headaches. She will follow up with Dr. Lajoyce Corners for her post-op care this Thursday (06/19/15).   Darreld Mclean, MD 06/16/15 1919  Lyndal Pulley, MD 06/17/15 [phone]

## 2015-06-16 NOTE — ED Notes (Signed)
Pt sts HA and nausea since having sx on Friday after lacerating foot

## 2015-06-16 NOTE — Discharge Instructions (Signed)
Your headache is likely due to a migraine or tension/stress type headache. This may have been a side effect from the Norco and Tramadol. You can try Ibuprofen 800 mg every 8 hours as needed for your headache and pain.   Please follow up with Dr. Lajoyce Cornersuda as scheduled for your foot care.    Migraine Headache A migraine headache is very bad, throbbing pain on one or both sides of your head. Talk to your doctor about what things may bring on (trigger) your migraine headaches. HOME CARE  Only take medicines as told by your doctor.  Lie down in a dark, quiet room when you have a migraine.  Keep a journal to find out if certain things bring on migraine headaches. For example, write down:  What you eat and drink.  How much sleep you get.  Any change to your diet or medicines.  Lessen how much alcohol you drink.  Quit smoking if you smoke.  Get enough sleep.  Lessen any stress in your life.  Keep lights dim if bright lights bother you or make your migraines worse. GET HELP RIGHT AWAY IF:   Your migraine becomes really bad.  You have a fever.  You have a stiff neck.  You have trouble seeing.  Your muscles are weak, or you lose muscle control.  You lose your balance or have trouble walking.  You feel like you will pass out (faint), or you pass out.  You have really bad symptoms that are different than your first symptoms. MAKE SURE YOU:   Understand these instructions.  Will watch your condition.  Will get help right away if you are not doing well or get worse.   This information is not intended to replace advice given to you by your health care provider. Make sure you discuss any questions you have with your health care provider.   Document Released: 12/02/2007 Document Revised: 05/17/2011 Document Reviewed: 10/30/2012 Elsevier Interactive Patient Education [employer]2016 [employer]

## 2015-06-17 ENCOUNTER — Encounter (HOSPITAL_COMMUNITY): Payer: Self-pay | Admitting: Orthopedic Surgery

## 2017-09-09 ENCOUNTER — Emergency Department (HOSPITAL_BASED_OUTPATIENT_CLINIC_OR_DEPARTMENT_OTHER)
Admission: EM | Admit: 2017-09-09 | Discharge: 2017-09-09 | Disposition: A | Discharge status: Home / Self Care | Payer: BLUE CROSS/BLUE SHIELD | Attending: Emergency Medicine | Admitting: Emergency Medicine

## 2017-09-09 ENCOUNTER — Other Ambulatory Visit: Payer: Self-pay

## 2017-09-09 ENCOUNTER — Encounter (HOSPITAL_BASED_OUTPATIENT_CLINIC_OR_DEPARTMENT_OTHER): Payer: Self-pay | Admitting: Emergency Medicine

## 2017-09-09 ENCOUNTER — Emergency Department (HOSPITAL_BASED_OUTPATIENT_CLINIC_OR_DEPARTMENT_OTHER): Payer: BLUE CROSS/BLUE SHIELD

## 2017-09-09 DIAGNOSIS — W228XXA Striking against or struck by other objects, initial encounter: Secondary | ICD-10-CM | POA: Insufficient documentation

## 2017-09-09 DIAGNOSIS — Z79899 Other long term (current) drug therapy: Secondary | ICD-10-CM | POA: Insufficient documentation

## 2017-09-09 DIAGNOSIS — Y9389 Activity, other specified: Secondary | ICD-10-CM | POA: Diagnosis not present

## 2017-09-09 DIAGNOSIS — Y998 Other external cause status: Secondary | ICD-10-CM | POA: Diagnosis not present

## 2017-09-09 DIAGNOSIS — Y929 Unspecified place or not applicable: Secondary | ICD-10-CM | POA: Diagnosis not present

## 2017-09-09 DIAGNOSIS — S92514A Nondisplaced fracture of proximal phalanx of right lesser toe(s), initial encounter for closed fracture: Secondary | ICD-10-CM | POA: Insufficient documentation

## 2017-09-09 DIAGNOSIS — Z91040 Latex allergy status: Secondary | ICD-10-CM | POA: Insufficient documentation

## 2017-09-09 DIAGNOSIS — S99921A Unspecified injury of right foot, initial encounter: Secondary | ICD-10-CM | POA: Diagnosis present

## 2017-09-09 IMAGING — DX DG FOOT COMPLETE 3+V*R*
3 series · 3 of 3 positions shown · non-contrast
Comparison: [DATE]

CLINICAL DATA: Injury lateral aspect of right foot

EXAM:
RIGHT FOOT COMPLETE - 3+ VIEW

[foot ap]
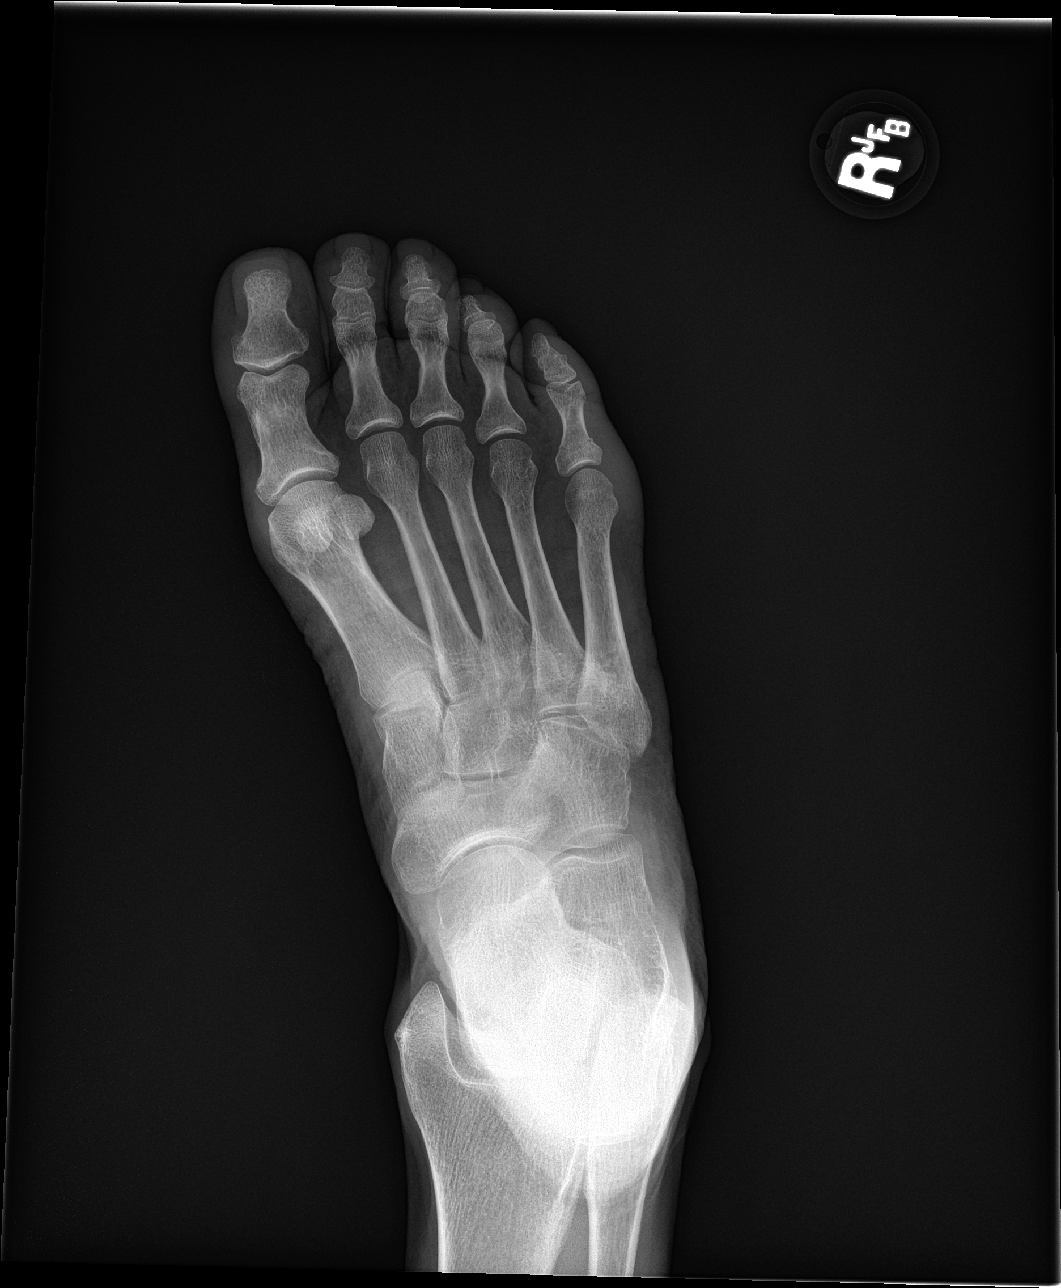

[foot obl]
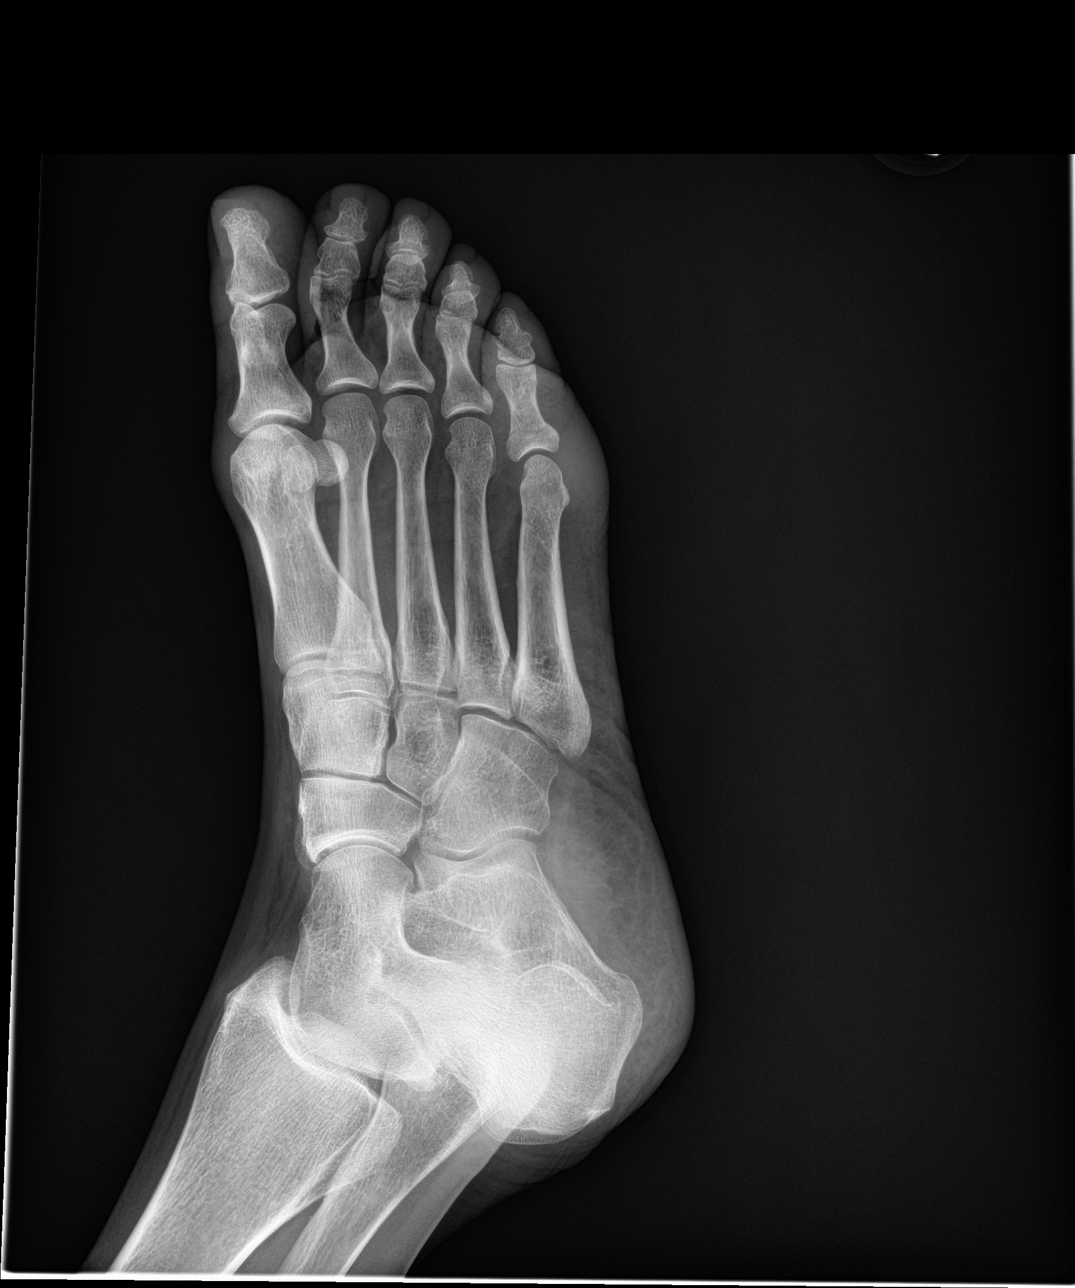

[foot lat]
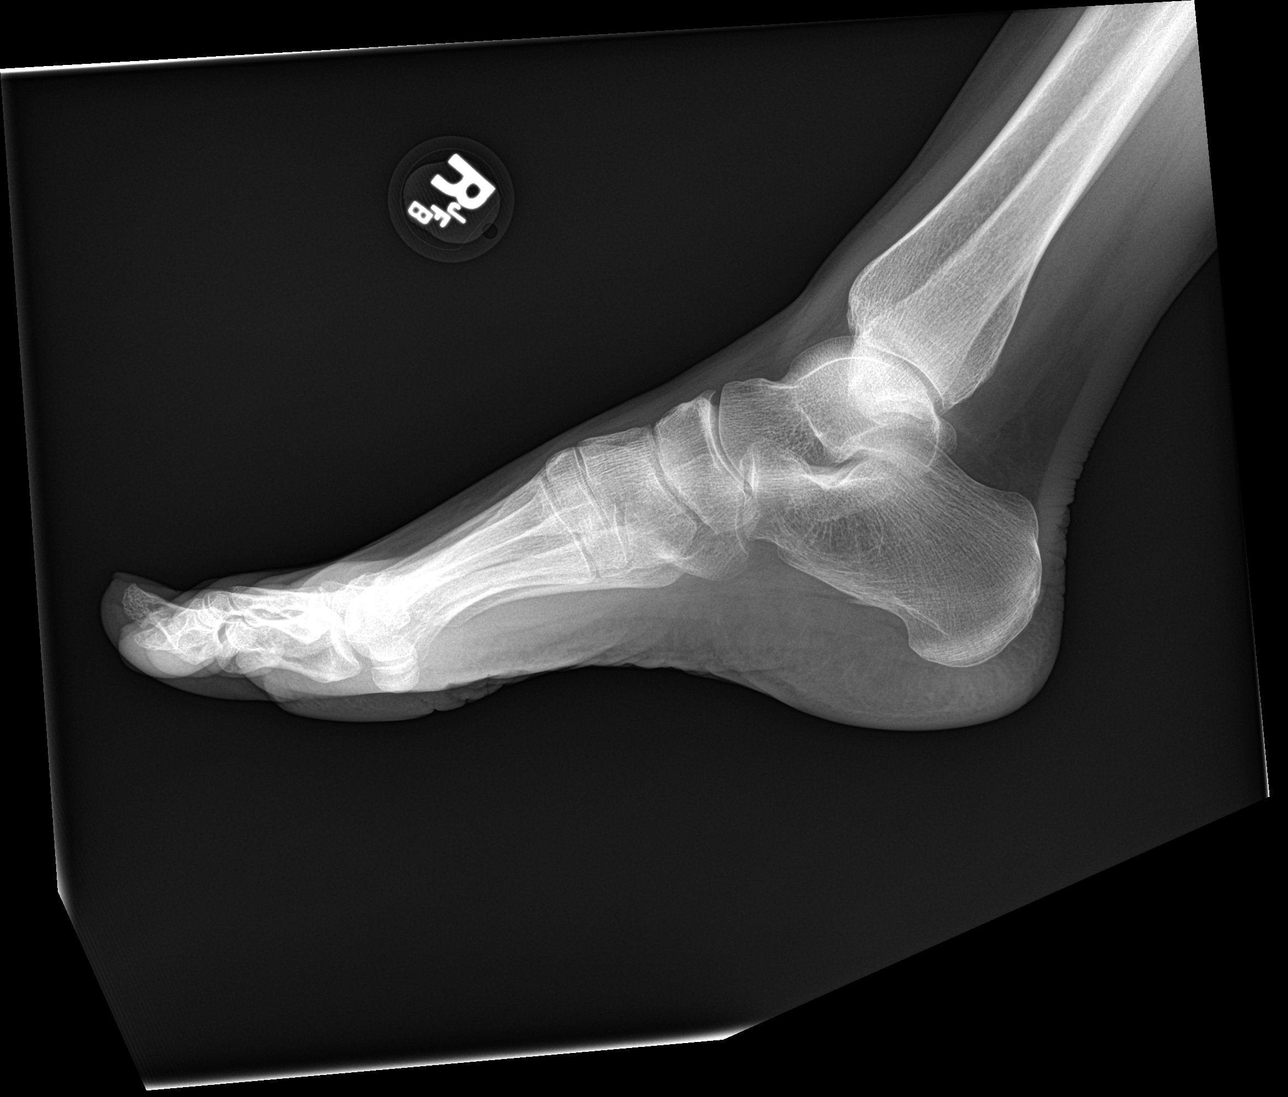

[3 of 3 positions shown; findings below may reference images not displayed]

FINDINGS: Nondisplaced fracture noted through the proximal phalanx of the
right 5th toe. No subluxation or dislocation. Soft tissues are
intact.
IMPRESSION: Nondisplaced fracture through the proximal aspect of the right 5th
toe proximal phalanx.

## 2017-09-09 MED ORDER — HYDROCODONE-ACETAMINOPHEN 5-325 MG PO TABS: 1.0000 | ORAL_TABLET | Freq: Four times a day (QID) | ORAL | 0 refills | Status: DC | PRN

## 2017-09-09 MED FILL — HYDROCODON-APAP 5-325: 5-325 | 2 days supply | Qty: 8 | Fill #0

## 2017-09-09 NOTE — ED Notes (Signed)
ED Provider at bedside. 

## 2017-09-09 NOTE — ED Triage Notes (Signed)
Reports hit right foot on door Friday night.  Reports continuous pain since then.

## 2017-09-09 NOTE — Discharge Instructions (Signed)
Keep your toes taped together for 4 to 6 weeks.  Use postop shoe until you can walk without pain.  Use ice 3-4 times daily alternating 20 minutes on, 20 minutes off.  Take ibuprofen every 8 hours.  For breakthrough pain, you can take 1 Norco every 6 hours. Do not drink alcohol, drive, operate machinery or participate in any other potentially dangerous activities while taking opiate pain medication as it may make you sleepy. Do not take this medication with any other sedating medications, either prescription or over-the-counter. If you were prescribed Percocet or Vicodin, do not take these with acetaminophen (Tylenol) as it is already contained within these medications and overdose of Tylenol is dangerous.   This medication is an opiate (or narcotic) pain medication and can be habit forming.  Use it as little as possible to achieve adequate pain control.  Do not use or use it with extreme caution if you have a history of opiate abuse or dependence. This medication is intended for your use only - do not give any to anyone else and keep it in a secure place where nobody else, especially children, have access to it. It will also cause or worsen constipation, so you may want to consider taking an over-the-counter stool softener while you are taking this medication.

## 2017-09-09 NOTE — ED Notes (Signed)
Pt verbalizes understanding of d/c instructions and denies any further needs at this time. 

## 2017-09-09 NOTE — ED Provider Notes (Signed)
[facility] EMERGENCY DEPARTMENT Provider Note   CSN: [id number] Arrival date & time: 09/09/17  1547     History   Chief Complaint Chief Complaint  Patient presents with  . Foot Injury    HPI Misty Webb is a 37 y.o. female who presents with right small toe pain after catching on the door 2 nights ago.  Patient has had swelling and bruising associated.  She has been taking ibuprofen and using ice with some relief.  She denies any numbness or tingling.  She denies pain elsewhere.  HPI  Past Medical History:  Diagnosis Date  . Complication of anesthesia    Woke up during endo and tried to pull tube out.  "Easily nauseated"  . Family history of adverse reaction to anesthesia    MOTHER NAUSEA.  FATHER - difficulty waking up  . GERD (gastroesophageal reflux disease)   . Insomnia   . Pneumonia    in her 5s  . Seasonal allergies   . UTI (urinary tract infection)    approx- 1 a year    There are no active problems to display for this patient.   Past Surgical History:  Procedure Laterality Date  . REPAIR EXTENSOR TENDON Left 06/13/2015   Procedure: Reconstruction Extensor Tendon Left Foot;  Surgeon: Nadara Mustard, MD;  Location: [facility] OR;  Service: Orthopedics;  Laterality: Left;  . UPPER GASTROINTESTINAL ENDOSCOPY       OB History   None      Home Medications    Prior to Admission medications   Medication Sig Start Date End Date Taking? Authorizing Provider  Biotin w/ Vitamins C & E (HAIR/SKIN/NAILS PO) Take 1 tablet by mouth daily.    [provider]  cetirizine-pseudoephedrine (ZYRTEC-D) 5-120 MG tablet Take 1 tablet by mouth daily.     [provider]  clonazePAM (KLONOPIN) 1 MG tablet Take 0.25-0.5 mg by mouth daily as needed for anxiety.    [provider]  HYDROcodone-acetaminophen (NORCO/VICODIN) 5-325 MG tablet Take 1-2 tablets by mouth every 6 (six) hours as needed. 09/09/17   Talullah Abate, Waylan Boga, PA-C  ibuprofen  (ADVIL,MOTRIN) 800 MG tablet Take 1 tablet (800 mg total) by mouth every 8 (eight) hours as needed for headache. 06/16/15   Darreld Mclean, MD  omeprazole (PRILOSEC) 20 MG capsule Take 20 mg by mouth daily. Reported on 06/16/2015    [provider]  simethicone (MYLICON) 80 MG chewable tablet Chew 80 mg by mouth every 6 (six) hours as needed for flatulence.    [provider]  traMADol (ULTRAM) 50 MG tablet Take 1 tablet (50 mg total) by mouth every 6 (six) hours as needed. Maximum dose= 8 tablets per day 06/13/15   Nadara Mustard, MD  zolpidem (AMBIEN) 10 MG tablet Take 10 mg by mouth at bedtime as needed for sleep.    [provider]    Family History History reviewed. No pertinent family history.  Social History Social History   Tobacco Use  . Smoking status: Never Smoker  Substance Use Topics  . Alcohol use: No    Comment: rarely  . Drug use: No     Allergies   Latex and Penicillins   Review of Systems Review of Systems  Constitutional: Negative for fever.  Musculoskeletal: Positive for arthralgias and joint swelling.  Neurological: Negative for numbness.     Physical Exam Updated Vital Signs BP 101/78   Pulse 89   Temp 97.7 F (36.5 C) (Oral)  Resp 16   Ht 5\' 5"  (1.651 m)   Wt 53.5 kg (118 lb)   LMP 08/15/2017 (Approximate)   SpO2 100%   BMI 19.64 kg/m   Physical Exam  Constitutional: She appears well-developed and well-nourished. No distress.  HENT:  Head: Normocephalic and atraumatic.  Mouth/Throat: Oropharynx is clear and moist. No oropharyngeal exudate.  Eyes: Pupils are equal, round, and reactive to light. Conjunctivae are normal. Right eye exhibits no discharge. Left eye exhibits no discharge. No scleral icterus.  Neck: Normal range of motion. Neck supple.  Cardiovascular: Normal rate, regular rhythm, normal heart sounds and intact distal pulses. Exam reveals no gallop and no friction rub.  No murmur heard. Pulmonary/Chest:  Effort normal and breath sounds normal. No stridor. No respiratory distress. She has no wheezes. She has no rales.  Musculoskeletal: She exhibits no edema.  Tenderness and ecchymosis to the right fifth toe extending into the metatarsal space of the fourth and fifth digits; range of motion intact, sensation intact, cap refill less than 2 seconds, DP pulses intact, no remaining foot tenderness or ankle tenderness  Neurological: She is alert. Coordination normal.  Skin: Skin is warm and dry. No rash noted. She is not diaphoretic. No pallor.  Psychiatric: She has a normal mood and affect.  Nursing note and vitals reviewed.    ED Treatments / Results  Labs (all labs ordered are listed, but only abnormal results are displayed) Labs Reviewed - No data to display  EKG None  Radiology Dg Foot Complete Right  Result Date: 09/09/2017 CLINICAL DATA:  Injury lateral aspect of right foot EXAM: RIGHT FOOT COMPLETE - 3+ VIEW COMPARISON:  07/13/2014 FINDINGS: Nondisplaced fracture noted through the proximal phalanx of the right 5th toe. No subluxation or dislocation. Soft tissues are intact. IMPRESSION: Nondisplaced fracture through the proximal aspect of the right 5th toe proximal phalanx. Electronically Signed   By: Charlett NoseKevin  Dover M.D.   On: 09/09/2017 16:15    Procedures Procedures (including critical care time)  Medications Ordered in ED Medications - No data to display   Initial Impression / Assessment and Plan / ED Course  I have reviewed the triage vital signs and the nursing notes.  Pertinent labs & imaging results that were available during my care of the patient were reviewed by me and considered in my medical decision making (see chart for details).     Patient with nondisplaced fracture through the proximal aspect of the right fifth toe proximal phalanx.  Patient placed in postop shoe and buddy taped.  Follow-up to PCP for further management.  Patient advised to keep buddy tape and  postop shoe in place for 4 to 6 weeks or follow-up with PCP.  Will discharge home with very short course of Norco for pain control.  I reviewed the [place] narcotic database and found no discrepancies. Supportive treatment including ice discussed.  Patient understands and agrees with plan.  Patient vitals stable throughout ED course and discharged in satisfactory condition.  Final Clinical Impressions(s) / ED Diagnoses   Final diagnoses:  Closed nondisplaced fracture of proximal phalanx of lesser toe of right foot, initial encounter    ED Discharge Orders        Ordered    HYDROcodone-acetaminophen (NORCO/VICODIN) 5-325 MG tablet  Every 6 hours PRN     09/09/17 1636       Emi HolesLaw, Roshawna Colclasure M, PA-C 09/09/17 1637    Arby BarrettePfeiffer, Marcy, MD 09/13/17 1234

## 2017-12-29 ENCOUNTER — Encounter (HOSPITAL_BASED_OUTPATIENT_CLINIC_OR_DEPARTMENT_OTHER): Payer: Self-pay

## 2017-12-29 ENCOUNTER — Emergency Department (HOSPITAL_BASED_OUTPATIENT_CLINIC_OR_DEPARTMENT_OTHER)
Admission: EM | Admit: 2017-12-29 | Discharge: 2017-12-30 | Disposition: A | Discharge status: Other Acute Inpatient Hospital | Payer: BLUE CROSS/BLUE SHIELD | Attending: Emergency Medicine | Admitting: Emergency Medicine

## 2017-12-29 ENCOUNTER — Emergency Department (HOSPITAL_BASED_OUTPATIENT_CLINIC_OR_DEPARTMENT_OTHER): Payer: BLUE CROSS/BLUE SHIELD

## 2017-12-29 ENCOUNTER — Other Ambulatory Visit: Payer: Self-pay

## 2017-12-29 DIAGNOSIS — K529 Noninfective gastroenteritis and colitis, unspecified: Secondary | ICD-10-CM | POA: Diagnosis not present

## 2017-12-29 DIAGNOSIS — R1030 Lower abdominal pain, unspecified: Secondary | ICD-10-CM | POA: Diagnosis present

## 2017-12-29 DIAGNOSIS — Z79899 Other long term (current) drug therapy: Secondary | ICD-10-CM | POA: Insufficient documentation

## 2017-12-29 LAB — CBC WITH DIFFERENTIAL/PLATELET
ABS IMMATURE GRANULOCYTES: 0.05 10*3/uL (ref 0.00–0.07)
BASOS PCT: 0 %
Basophils Absolute: 0 10*3/uL (ref 0.0–0.1)
EOS PCT: 0 %
Eosinophils Absolute: 0 10*3/uL (ref 0.0–0.5)
HCT: 39 % (ref 36.0–46.0)
Hemoglobin: 12.4 g/dL (ref 12.0–15.0)
Immature Granulocytes: 0 %
LYMPHS PCT: 13 %
Lymphs Abs: 1.7 10*3/uL (ref 0.7–4.0)
MCH: 29.2 pg (ref 26.0–34.0)
MCHC: 31.8 g/dL (ref 30.0–36.0)
MCV: 92 fL (ref 80.0–100.0)
Monocytes Absolute: 1.1 10*3/uL — ABNORMAL HIGH (ref 0.1–1.0)
Monocytes Relative: 8 %
NEUTROS ABS: 10.8 10*3/uL — AB (ref 1.7–7.7)
NEUTROS PCT: 79 %
PLATELETS: 291 10*3/uL (ref 150–400)
RBC: 4.24 MIL/uL (ref 3.87–5.11)
RDW: 12 % (ref 11.5–15.5)
WBC: 13.6 10*3/uL — AB (ref 4.0–10.5)
nRBC: 0 % (ref 0.0–0.2)

## 2017-12-29 LAB — URINALYSIS, ROUTINE W REFLEX MICROSCOPIC
Bilirubin Urine: NEGATIVE
GLUCOSE, UA: NEGATIVE mg/dL
HGB URINE DIPSTICK: NEGATIVE
Ketones, ur: 15 mg/dL — AB
LEUKOCYTES UA: NEGATIVE
Nitrite: NEGATIVE
PH: 6.5 (ref 5.0–8.0)
Protein, ur: NEGATIVE mg/dL
Specific Gravity, Urine: 1.02 (ref 1.005–1.030)

## 2017-12-29 LAB — COMPREHENSIVE METABOLIC PANEL
ALBUMIN: 3.7 g/dL (ref 3.5–5.0)
ALT: 23 U/L (ref 0–44)
ANION GAP: 8 (ref 5–15)
AST: 24 U/L (ref 15–41)
Alkaline Phosphatase: 83 U/L (ref 38–126)
BUN: 8 mg/dL (ref 6–20)
CHLORIDE: 102 mmol/L (ref 98–111)
CO2: 26 mmol/L (ref 22–32)
Calcium: 8.9 mg/dL (ref 8.9–10.3)
Creatinine, Ser: 0.49 mg/dL (ref 0.44–1.00)
GFR calc Af Amer: >60 mL/min (ref >60)
Glucose, Bld: 119 mg/dL — ABNORMAL HIGH (ref 70–99)
POTASSIUM: 3.2 mmol/L — AB (ref 3.5–5.1)
Sodium: 136 mmol/L (ref 135–145)
Total Bilirubin: 1.2 mg/dL (ref 0.3–1.2)
Total Protein: 6.6 g/dL (ref 6.5–8.1)

## 2017-12-29 LAB — PREGNANCY, URINE: Preg Test, Ur: NEGATIVE

## 2017-12-29 IMAGING — CT CT ABD-PELV W/ CM
2 of 4 series · 15 of 46 positions shown, 17 images · IV contrast (APPLIED)
Comparison: Right upper quadrant ultrasound [DATE]

CLINICAL DATA: Abdominal pain, fever

EXAM:
CT ABDOMEN AND PELVIS WITH CONTRAST
TECHNIQUE: Multidetector CT imaging of the abdomen and pelvis was performed
using the standard protocol following bolus administration of
intravenous contrast.
CONTRAST:  100mL [G6] IOPAMIDOL ([G6]) INJECTION 61%

[Series 2: axial st · axial · 0.69mm/px · z∈[-558,-138]mm · 12 of 94 slices shown, 14 images]
[im 5/94  soft-tissue]
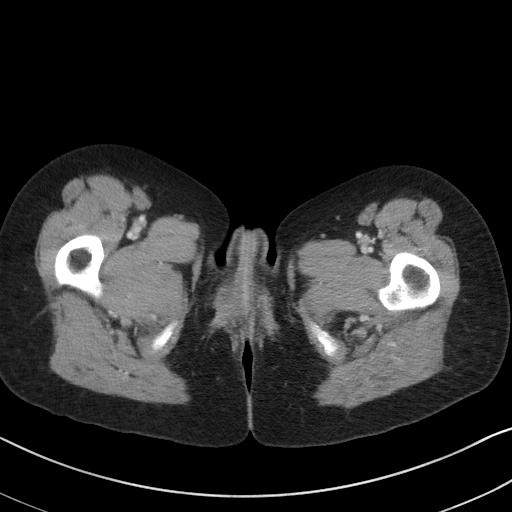
[im 5/94  bone]
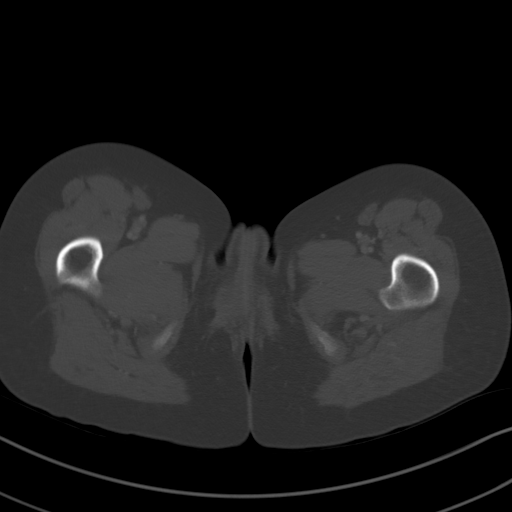
[im 13/94  soft-tissue]
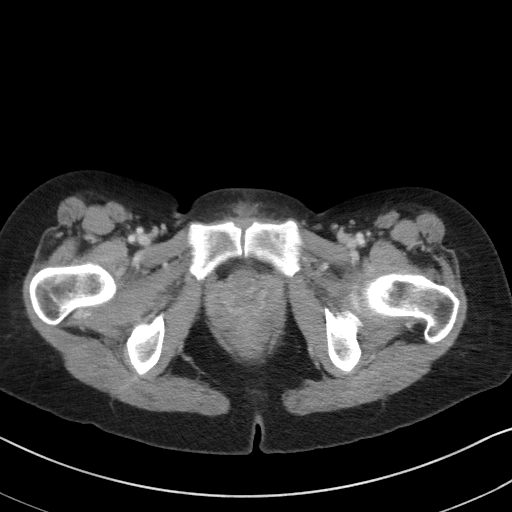
[im 21/94  soft-tissue]
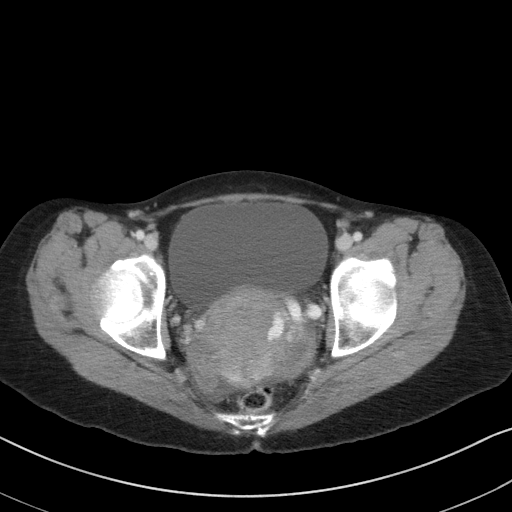
[im 29/94  soft-tissue]
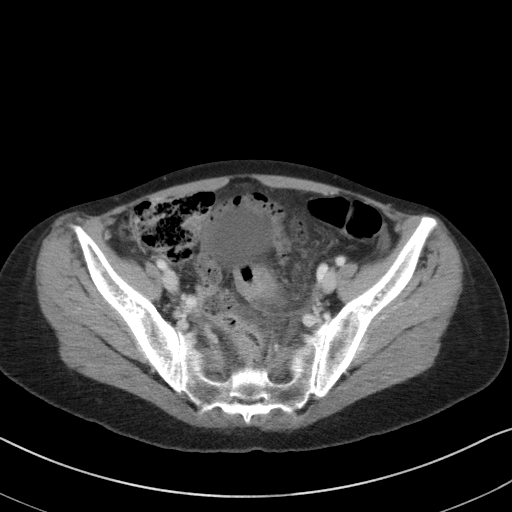
[im 37/94  soft-tissue]
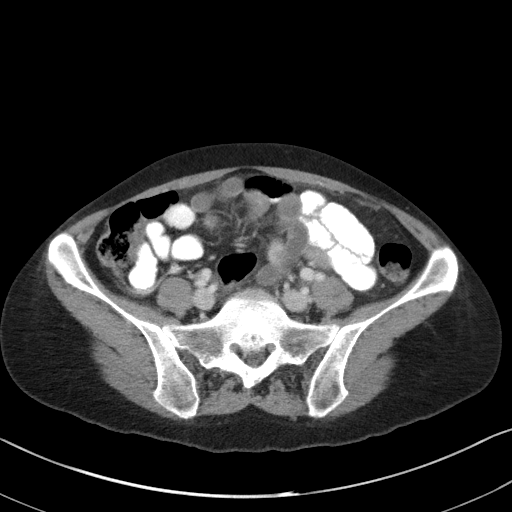
[im 45/94  soft-tissue]
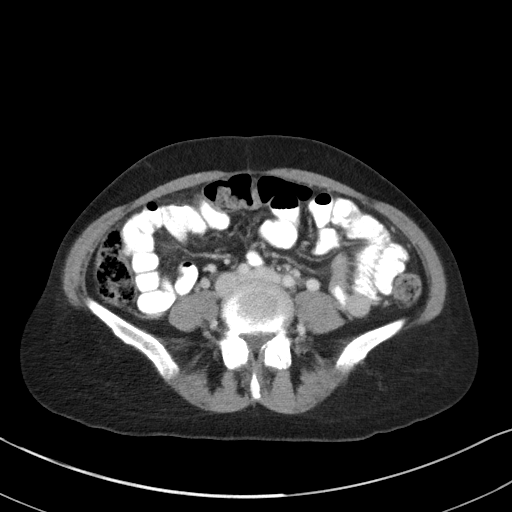
[im 49/94  soft-tissue]
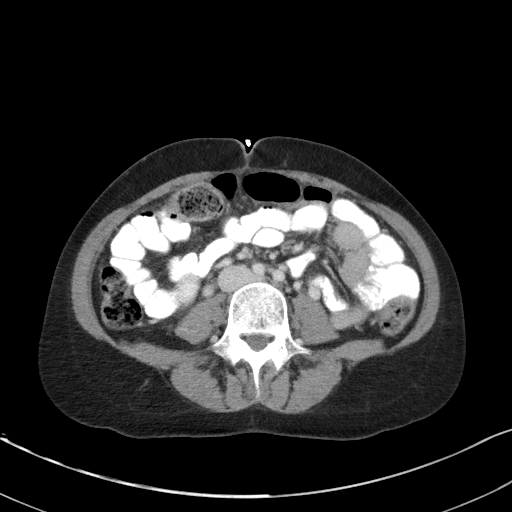
[im 57/94  soft-tissue]
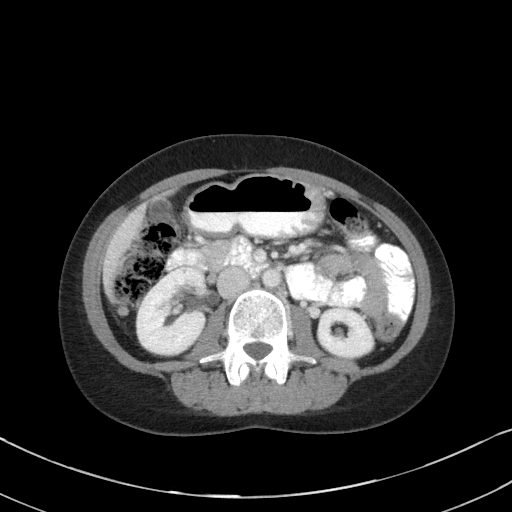
[im 65/94  soft-tissue]
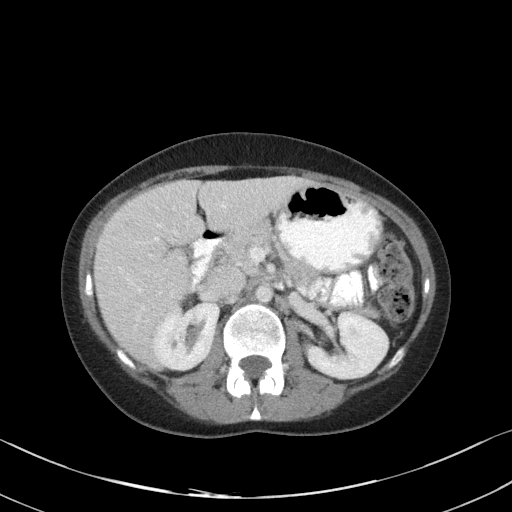
[im 65/94  bone]
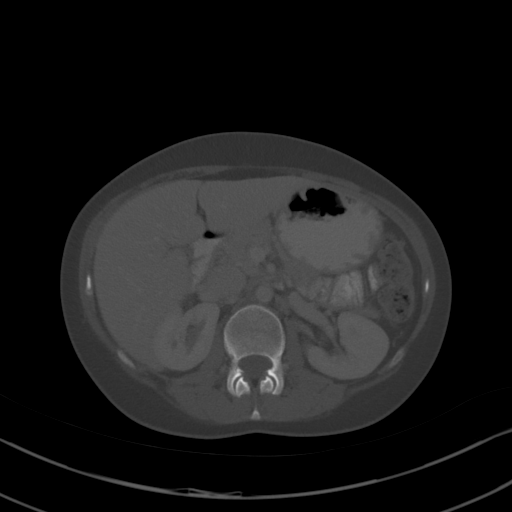
[im 73/94  soft-tissue]
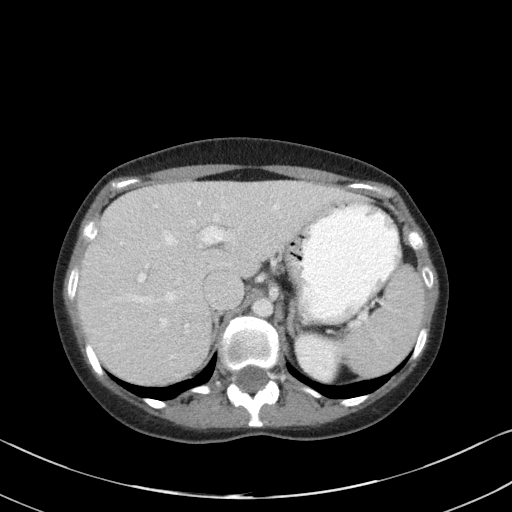
[im 81/94  soft-tissue]
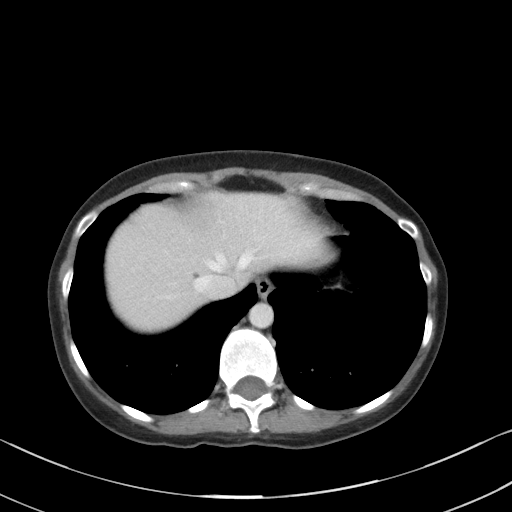
[im 89/94  soft-tissue]
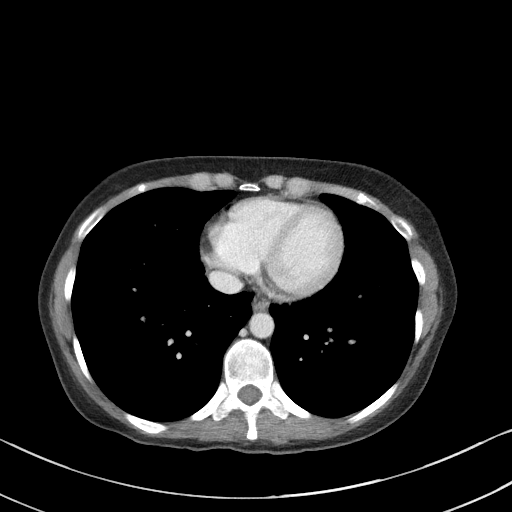

[Series 5: coronal st · coronal · 0.60mm/px · 3 of 74 slices shown]
[im 25/74  soft-tissue]
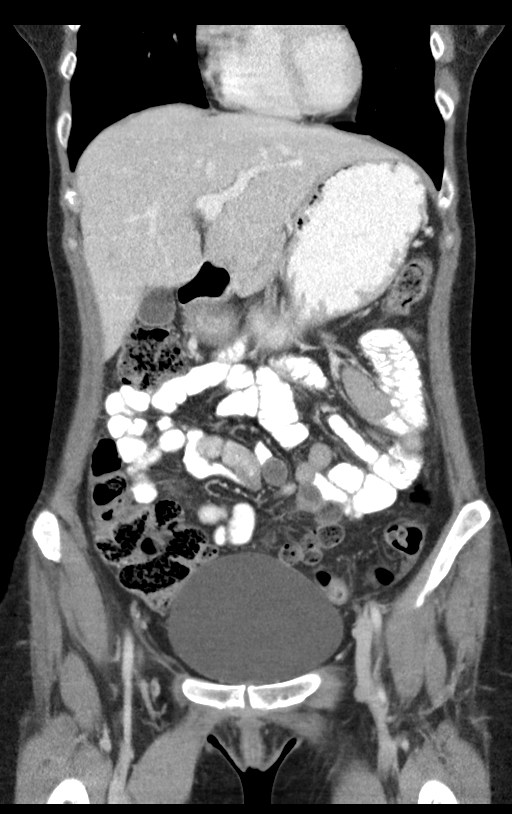
[im 33/74  soft-tissue]
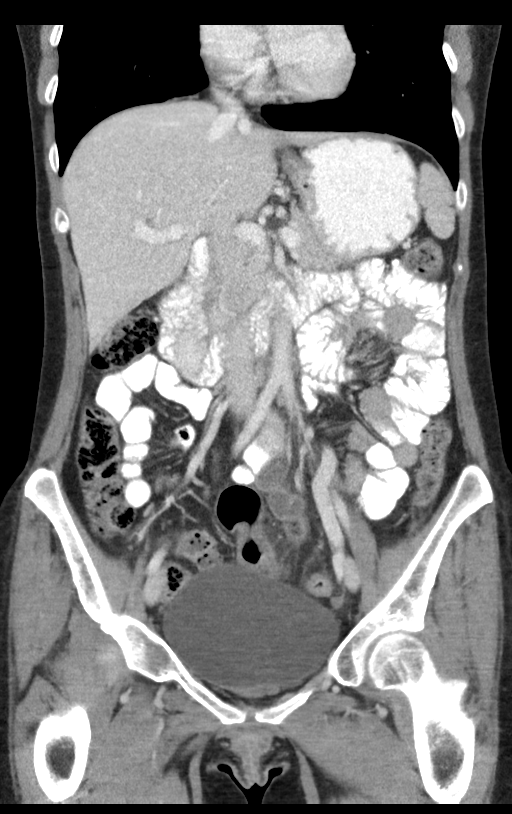
[im 41/74  soft-tissue]
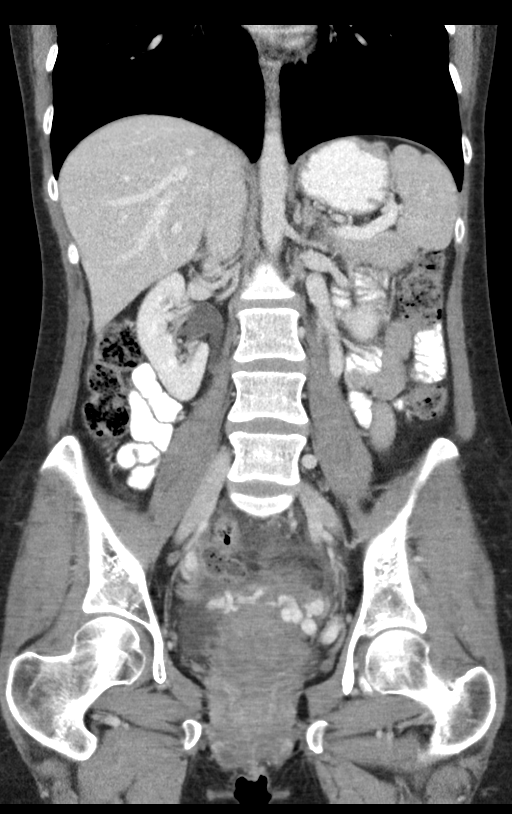

[15 of 46 positions shown; findings below may reference images not displayed]

FINDINGS: Lower chest: Lung bases are clear. No effusions. Heart is normal
size.

Hepatobiliary: No focal hepatic abnormality. Gallbladder
unremarkable.

Pancreas: No focal abnormality or ductal dilatation.

Spleen: No focal abnormality.  Normal size.

Adrenals/Urinary Tract: No adrenal abnormality. No focal renal
abnormality. No stones or hydronephrosis. Urinary bladder is
unremarkable.

Stomach/Bowel: There is abnormal wall thickening within the sigmoid
colon and surrounding inflammation concerning for colitis. I see no
diverticula in the colon to suggest diverticulitis. I suspect this
is infectious or inflammatory colitis. Appendix not definitively
seen, but no pericecal inflammation. Small bowel and stomach
unremarkable.

Vascular/Lymphatic: No evidence of aneurysm or adenopathy.

Reproductive: Prominent periuterine vessels. The left ovarian vein
is enlarged. This may reflect reflux. Uterus and adnexa
unremarkable.

Other: Trace free fluid in the pelvis.  No free air.

Musculoskeletal: No acute bony abnormality.
IMPRESSION: Circumferential wall thickening within the mid sigmoid colon with
surrounding inflammatory stranding, compatible with infectious or
inflammatory colitis.

Enlarged left ovarian vein and periuterine vessels which may reflect
ovarian vein reflux. This can be associated with pelvic congestion
syndrome.

## 2017-12-29 MED ORDER — SODIUM CHLORIDE 0.9 % IV BOLUS
1000.0000 mL | Freq: Once | INTRAVENOUS | Status: AC
  Administered 2017-12-29: 1000 mL via INTRAVENOUS

## 2017-12-29 MED ORDER — ONDANSETRON HCL 4 MG/2ML IJ SOLN
4.0000 mg | Freq: Once | INTRAMUSCULAR | Status: AC
  Administered 2017-12-29: 4 mg via INTRAVENOUS
  Filled 2017-12-29: qty 2

## 2017-12-29 MED ORDER — CIPROFLOXACIN IN D5W 400 MG/200ML IV SOLN
400.0000 mg | Freq: Once | INTRAVENOUS | Status: AC
  Administered 2017-12-30: 400 mg via INTRAVENOUS
  Filled 2017-12-29: qty 200

## 2017-12-29 MED ORDER — FENTANYL CITRATE (PF) 100 MCG/2ML IJ SOLN
25.0000 ug | Freq: Once | INTRAMUSCULAR | Status: AC
  Administered 2017-12-29: 25 ug via INTRAVENOUS
  Filled 2017-12-29: qty 2

## 2017-12-29 MED ORDER — IOPAMIDOL (ISOVUE-300) INJECTION 61%
100.0000 mL | Freq: Once | INTRAVENOUS | Status: AC | PRN
  Administered 2017-12-29: 100 mL via INTRAVENOUS

## 2017-12-29 MED ORDER — METRONIDAZOLE IN NACL 5-0.79 MG/ML-% IV SOLN
500.0000 mg | Freq: Once | INTRAVENOUS | Status: AC
  Administered 2017-12-29: 500 mg via INTRAVENOUS
  Filled 2017-12-29: qty 100

## 2017-12-29 NOTE — ED Notes (Signed)
Called PAL line for consult to the hospitalist @ [facility]. 

## 2017-12-29 NOTE — ED Provider Notes (Signed)
[facility] EMERGENCY DEPARTMENT Provider Note   CSN: [id number] Arrival date & time: 12/29/17  2021     History   Chief Complaint Chief Complaint  Patient presents with  . Abdominal Pain    HPI Misty Webb is a 37 y.o. female.  HPI Patient presents with abdominal pain and fever.  The fever started yesterday up to 102.4.  States that she had some periumbilical pain at that time it is now spread more to the lower abdomen.  States movement and coughing make it worse.  States the bumps in the road hurt her.  She has had nausea and vomited once.  States she has had no dysuria.  States she has had constipation however.  Does not think she is pregnant.  No vaginal discharge or bleeding.  Pain is dull and somewhat severe.  She has not had previous abdominal surgery. Past Medical History:  Diagnosis Date  . Complication of anesthesia    Woke up during endo and tried to pull tube out.  "Easily nauseated"  . Family history of adverse reaction to anesthesia    MOTHER NAUSEA.  FATHER - difficulty waking up  . GERD (gastroesophageal reflux disease)   . Insomnia   . Pneumonia    in her 7s  . Seasonal allergies   . UTI (urinary tract infection)    approx- 1 a year    There are no active problems to display for this patient.   Past Surgical History:  Procedure Laterality Date  . REPAIR EXTENSOR TENDON Left 06/13/2015   Procedure: Reconstruction Extensor Tendon Left Foot;  Surgeon: Nadara Mustard, MD;  Location: [facility] OR;  Service: Orthopedics;  Laterality: Left;  . UPPER GASTROINTESTINAL ENDOSCOPY       OB History   None      Home Medications    Prior to Admission medications   Medication Sig Start Date End Date Taking? Authorizing Provider  Biotin w/ Vitamins C & E (HAIR/SKIN/NAILS PO) Take 1 tablet by mouth daily.    [provider]  cetirizine-pseudoephedrine (ZYRTEC-D) 5-120 MG tablet Take 1 tablet by mouth daily.     [provider]    clonazePAM (KLONOPIN) 1 MG tablet Take 0.25-0.5 mg by mouth daily as needed for anxiety.    [provider]  HYDROcodone-acetaminophen (NORCO/VICODIN) 5-325 MG tablet Take 1-2 tablets by mouth every 6 (six) hours as needed. 09/09/17   Law, Waylan Boga, PA-C  ibuprofen (ADVIL,MOTRIN) 800 MG tablet Take 1 tablet (800 mg total) by mouth every 8 (eight) hours as needed for headache. 06/16/15   Charlsie Quest, MD  omeprazole (PRILOSEC) 20 MG capsule Take 20 mg by mouth daily. Reported on 06/16/2015    [provider]  simethicone (MYLICON) 80 MG chewable tablet Chew 80 mg by mouth every 6 (six) hours as needed for flatulence.    [provider]  traMADol (ULTRAM) 50 MG tablet Take 1 tablet (50 mg total) by mouth every 6 (six) hours as needed. Maximum dose= 8 tablets per day 06/13/15   Nadara Mustard, MD  zolpidem (AMBIEN) 10 MG tablet Take 10 mg by mouth at bedtime as needed for sleep.    [provider]    Family History No family history on file.  Social History Social History   Tobacco Use  . Smoking status: Never Smoker  . Smokeless tobacco: Never Used  Substance Use Topics  . Alcohol use: Yes    Comment: rarely  . Drug  use: No     Allergies   Latex and Penicillins   Review of Systems Review of Systems  Constitutional: Positive for appetite change and fever.  HENT: Negative for congestion.   Respiratory: Negative for shortness of breath.   Cardiovascular: Negative for chest pain.  Gastrointestinal: Positive for abdominal pain, constipation, nausea and vomiting.  Genitourinary: Negative for dysuria, vaginal bleeding and vaginal discharge.  Musculoskeletal: Negative for back pain.  Skin: Negative for rash.  Neurological: Negative for weakness.  Psychiatric/Behavioral: Negative for confusion.     Physical Exam Updated Vital Signs BP (!) 119/98 (BP Location: Left Arm)   Pulse 100   Temp 98.4 F (36.9 C) (Oral)   Resp 20   LMP 12/17/2017    SpO2 100%   Physical Exam  Constitutional: She appears well-developed.  HENT:  Head: Atraumatic.  Pulmonary/Chest: Breath sounds normal.  Abdominal: Normal appearance.  Moderate lower abdominal tenderness with some fullness.  Some guarding.  Skin: Skin is warm. Capillary refill takes less than 2 seconds.     ED Treatments / Results  Labs (all labs ordered are listed, but only abnormal results are displayed) Labs Reviewed  COMPREHENSIVE METABOLIC PANEL - Abnormal; Notable for the following components:      Result Value   Potassium 3.2 (*)    Glucose, Bld 119 (*)    All other components within normal limits  CBC WITH DIFFERENTIAL/PLATELET - Abnormal; Notable for the following components:   WBC 13.6 (*)    Neutro Abs 10.8 (*)    Monocytes Absolute 1.1 (*)    All other components within normal limits  URINALYSIS, ROUTINE W REFLEX MICROSCOPIC - Abnormal; Notable for the following components:   APPearance HAZY (*)    Ketones, ur 15 (*)    All other components within normal limits  PREGNANCY, URINE    EKG None  Radiology Ct Abdomen Pelvis W Contrast  Result Date: 12/29/2017 CLINICAL DATA:  Abdominal pain, fever EXAM: CT ABDOMEN AND PELVIS WITH CONTRAST TECHNIQUE: Multidetector CT imaging of the abdomen and pelvis was performed using the standard protocol following bolus administration of intravenous contrast. CONTRAST:  ISOVUE-300 IOPAMIDOL (ISOVUE-300) INJECTION 61% COMPARISON:  Right upper quadrant ultrasound 08/17/2011 FINDINGS: Lower chest: Lung bases are clear. No effusions. Heart is normal size. Hepatobiliary: No focal hepatic abnormality. Gallbladder unremarkable. Pancreas: No focal abnormality or ductal dilatation. Spleen: No focal abnormality.  Normal size. Adrenals/Urinary Tract: No adrenal abnormality. No focal renal abnormality. No stones or hydronephrosis. Urinary bladder is unremarkable. Stomach/Bowel: There is abnormal wall thickening within the sigmoid colon  and surrounding inflammation concerning for colitis. I see no diverticula in the colon to suggest diverticulitis. I suspect this is infectious or inflammatory colitis. Appendix not definitively seen, but no pericecal inflammation. Small bowel and stomach unremarkable. Vascular/Lymphatic: No evidence of aneurysm or adenopathy. Reproductive: Prominent periuterine vessels. The left ovarian vein is enlarged. This may reflect reflux. Uterus and adnexa unremarkable. Other: Trace free fluid in the pelvis.  No free air. Musculoskeletal: No acute bony abnormality. IMPRESSION: Circumferential wall thickening within the mid sigmoid colon with surrounding inflammatory stranding, compatible with infectious or inflammatory colitis. Enlarged left ovarian vein and periuterine vessels which may reflect ovarian vein reflux. 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 Electronically Signed   By: Charlett Nose M.D.   On: 12/29/2017 22:38    Procedures Procedures (including critical care time)  Medications Ordered in ED Medications  sodium chloride 0.9 % bolus 1,000 mL ( Intravenous Stopped 12/29/17  2152)  ondansetron [facility]) injection 4 mg (4 mg Intravenous Given 12/29/17 2056)  fentaNYL (SUBLIMAZE) injection 25 mcg (25 mcg Intravenous Given 12/29/17 2158)  iopamidol (ISOVUE-300) 61 % injection 100 mL (100 mLs Intravenous Contrast Given 12/29/17 2225)     Initial Impression / Assessment and Plan / ED Course  I have reviewed the triage vital signs and the nursing notes.  Pertinent labs & imaging results that were available during my care of the patient were reviewed by me and considered in my medical decision making (see chart for details).     Patient with fevers and lower abdominal pain.  White count is elevated.  CT scan shows colitis.  Continued pain and has some tolerating orals.  Will admit to hospitalist.  Final Clinical Impressions(s) / ED Diagnoses   Final diagnoses:  Colitis    ED  Discharge Orders    None       Benjiman Core, MD 12/29/17 2248

## 2017-12-29 NOTE — ED Triage Notes (Signed)
C/o abd pain, fever started yesterday-NAD-steady gait

## 2017-12-30 MED ORDER — FENTANYL CITRATE (PF) 100 MCG/2ML IJ SOLN
25.0000 ug | INTRAMUSCULAR | Status: DC | PRN
  Administered 2017-12-30: 25 ug via INTRAVENOUS
  Filled 2017-12-30: qty 2

## 2017-12-30 MED ORDER — ONDANSETRON HCL 4 MG/2ML IJ SOLN
4.0000 mg | Freq: Once | INTRAMUSCULAR | Status: AC
  Administered 2017-12-30: 4 mg via INTRAVENOUS
  Filled 2017-12-30: qty 2

## 2018-03-23 ENCOUNTER — Other Ambulatory Visit: Payer: Self-pay

## 2018-03-23 ENCOUNTER — Emergency Department (HOSPITAL_BASED_OUTPATIENT_CLINIC_OR_DEPARTMENT_OTHER)
Admission: EM | Admit: 2018-03-23 | Discharge: 2018-03-23 | Disposition: A | Discharge status: Home / Self Care | Payer: BLUE CROSS/BLUE SHIELD | Attending: Emergency Medicine | Admitting: Emergency Medicine

## 2018-03-23 ENCOUNTER — Encounter (HOSPITAL_BASED_OUTPATIENT_CLINIC_OR_DEPARTMENT_OTHER): Payer: Self-pay

## 2018-03-23 DIAGNOSIS — J111 Influenza due to unidentified influenza virus with other respiratory manifestations: Secondary | ICD-10-CM

## 2018-03-23 DIAGNOSIS — R05 Cough: Secondary | ICD-10-CM | POA: Diagnosis present

## 2018-03-23 MED ORDER — OSELTAMIVIR PHOSPHATE 75 MG PO CAPS: 75.0000 mg | ORAL_CAPSULE | Freq: Two times a day (BID) | ORAL | 0 refills | Status: DC

## 2018-03-23 NOTE — Discharge Instructions (Addendum)
You were evaluated in the Emergency Department and after careful evaluation, we did not find any emergent condition requiring admission or further testing in the hospital.  Your symptoms today seem to be due to an early case of the flu.  Please continue to hydrate yourself at home and use the medication provided as directed.  Please return to the Emergency Department if you experience any worsening of your condition.  We encourage you to follow up with a primary care provider.  Thank you for allowing [place] to be a part of your care.

## 2018-03-23 NOTE — ED Provider Notes (Signed)
[facility] Emergency Department Provider Note MRN:  [id number]  Arrival date & time: 03/23/18     Chief Complaint   Cough   History of Present Illness   Misty Webb is a 38 y.o. year-old female with no pertinent past medical history presenting to the ED with chief complaint of cough.  Patient is multiple family members at home that of test positive with the flu.  Today she began experiencing body aches, general malaise, cough, mild sore throat.  Concerned that she is developing the flu.  Denies shortness of breath, no chest pain, no abdominal pain.  Symptoms are mild, constant, no exacerbating relieving factors.  Review of Systems  A complete 10 system review of systems was obtained and all systems are negative except as noted in the HPI and PMH.   Patient's Health History    Past Medical History:  Diagnosis Date  . Complication of anesthesia    Woke up during endo and tried to pull tube out.  "Easily nauseated"  . Family history of adverse reaction to anesthesia    MOTHER NAUSEA.  FATHER - difficulty waking up  . GERD (gastroesophageal reflux disease)   . Insomnia   . Pneumonia    in her 48s  . Seasonal allergies   . UTI (urinary tract infection)    approx- 1 a year    Past Surgical History:  Procedure Laterality Date  . REPAIR EXTENSOR TENDON Left 06/13/2015   Procedure: Reconstruction Extensor Tendon Left Foot;  Surgeon: Nadara Mustard, MD;  Location: [facility] OR;  Service: Orthopedics;  Laterality: Left;  . UPPER GASTROINTESTINAL ENDOSCOPY      No family history on file.  Social History   Socioeconomic History  . Marital status: Single    Spouse name: Not on file  . Number of children: Not on file  . Years of education: Not on file  . Highest education level: Not on file  Occupational History  . Not on file  Social Needs  . Financial resource strain: Not on file  . Food insecurity:    Worry: Not on file    Inability: Not on file  .  Transportation needs:    Medical: Not on file    Non-medical: Not on file  Tobacco Use  . Smoking status: Never Smoker  . Smokeless tobacco: Never Used  Substance and Sexual Activity  . Alcohol use: Yes    Comment: rarely  . Drug use: No  . Sexual activity: Not on file  Lifestyle  . Physical activity:    Days per week: Not on file    Minutes per session: Not on file  . Stress: Not on file  Relationships  . Social connections:    Talks on phone: Not on file    Gets together: Not on file    Attends religious service: Not on file    Active member of club or organization: Not on file    Attends meetings of clubs or organizations: Not on file    Relationship status: Not on file  . Intimate partner violence:    Fear of current or ex partner: Not on file    Emotionally abused: Not on file    Physically abused: Not on file    Forced sexual activity: Not on file  Other Topics Concern  . Not on file  Social History Narrative  . Not on file     Physical Exam  Vital Signs and Nursing Notes reviewed  Vitals:   03/23/18 1921  BP: 104/74  Pulse: 88  Resp: 16  Temp: 98.1 F (36.7 C)  SpO2: 100%    CONSTITUTIONAL: Well-appearing, NAD NEURO:  Alert and oriented x 3, no focal deficits EYES:  eyes equal and reactive ENT/NECK:  no LAD, no JVD CARDIO: Regular rate, well-perfused, normal S1 and S2 PULM:  CTAB no wheezing or rhonchi GI/GU:  normal bowel sounds, non-distended, non-tender MSK/SPINE:  No gross deformities, no edema SKIN:  no rash, atraumatic PSYCH:  Appropriate speech and behavior  Diagnostic and Interventional Summary    Labs Reviewed - No data to display  No orders to display    Medications - No data to display   Procedures Critical Care  ED Course and Medical Decision Making  I have reviewed the triage vital signs and the nursing notes.  Pertinent labs & imaging results that were available during my care of the patient were reviewed by me and considered  in my medical decision making (see below for details).  We will treat empirically for the flu given her exposure, symptoms.  Vital signs stable, lungs clear, well-appearing, uncomplicated case.  Return precautions.  After the discussed management above, the patient was determined to be safe for discharge.  The patient was in agreement with this plan and all questions regarding their care were answered.  ED return precautions were discussed and the patient will return to the ED with any significant worsening of condition.  Elmer Sow. Pilar Plate, MD [facility] Emergency Medicine [facility] [email or web address]  Final Clinical Impressions(s) / ED Diagnoses     ICD-10-CM   1. Influenza J11.1     ED Discharge Orders         Ordered    oseltamivir (TAMIFLU) 75 MG capsule  Every 12 hours     03/23/18 2020             Sabas Sous, MD 03/23/18 2023

## 2018-03-23 NOTE — ED Triage Notes (Addendum)
C/o flu like sx x today-reports +flu contact x 3-NAD-steady gait

## 2018-04-15 ENCOUNTER — Emergency Department (HOSPITAL_BASED_OUTPATIENT_CLINIC_OR_DEPARTMENT_OTHER)
Admission: EM | Admit: 2018-04-15 | Discharge: 2018-04-15 | Disposition: A | Discharge status: Home / Self Care | Payer: BLUE CROSS/BLUE SHIELD | Attending: Emergency Medicine | Admitting: Emergency Medicine

## 2018-04-15 ENCOUNTER — Emergency Department (HOSPITAL_BASED_OUTPATIENT_CLINIC_OR_DEPARTMENT_OTHER): Payer: BLUE CROSS/BLUE SHIELD

## 2018-04-15 ENCOUNTER — Other Ambulatory Visit: Payer: Self-pay

## 2018-04-15 ENCOUNTER — Encounter (HOSPITAL_BASED_OUTPATIENT_CLINIC_OR_DEPARTMENT_OTHER): Payer: Self-pay | Admitting: Emergency Medicine

## 2018-04-15 DIAGNOSIS — Z79899 Other long term (current) drug therapy: Secondary | ICD-10-CM | POA: Diagnosis not present

## 2018-04-15 DIAGNOSIS — M545 Low back pain, unspecified: Secondary | ICD-10-CM

## 2018-04-15 DIAGNOSIS — Z91040 Latex allergy status: Secondary | ICD-10-CM | POA: Diagnosis not present

## 2018-04-15 DIAGNOSIS — W101XXA Fall (on)(from) sidewalk curb, initial encounter: Secondary | ICD-10-CM | POA: Diagnosis not present

## 2018-04-15 DIAGNOSIS — W19XXXA Unspecified fall, initial encounter: Secondary | ICD-10-CM

## 2018-04-15 LAB — PREGNANCY, URINE: Preg Test, Ur: NEGATIVE

## 2018-04-15 IMAGING — CR DG LUMBAR SPINE COMPLETE 4+V
5 series · 5 of 5 positions shown · non-contrast
Comparison: Concurrently obtained radiographs of the sacrum; prior
CT scan of the abdomen and pelvis [DATE]

CLINICAL DATA: 37-year-old female with lumbar spine and coccyx pain
after falling while cleaning her car yesterday.

EXAM:
LUMBAR SPINE - COMPLETE 4+ VIEW

[t l-spine a.p.]
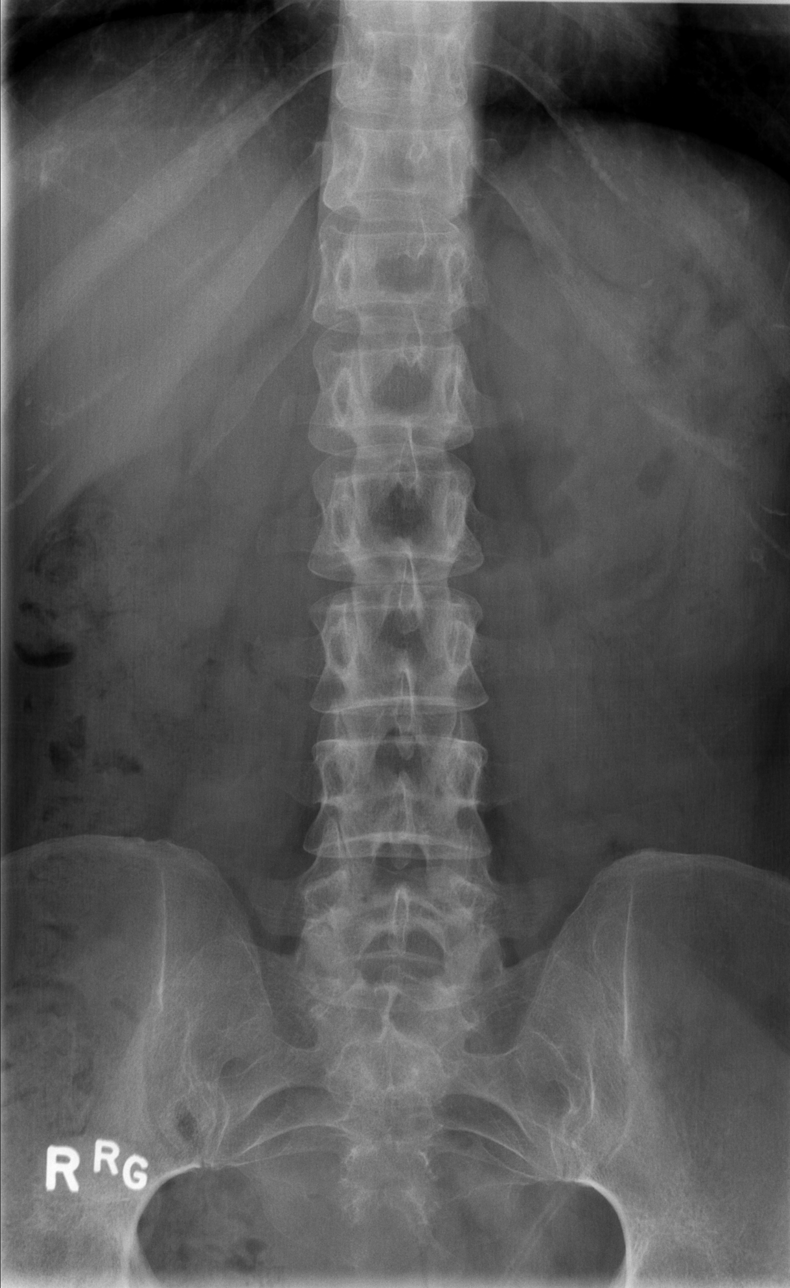

[t l-spine oblique exposure (1 of 2)]
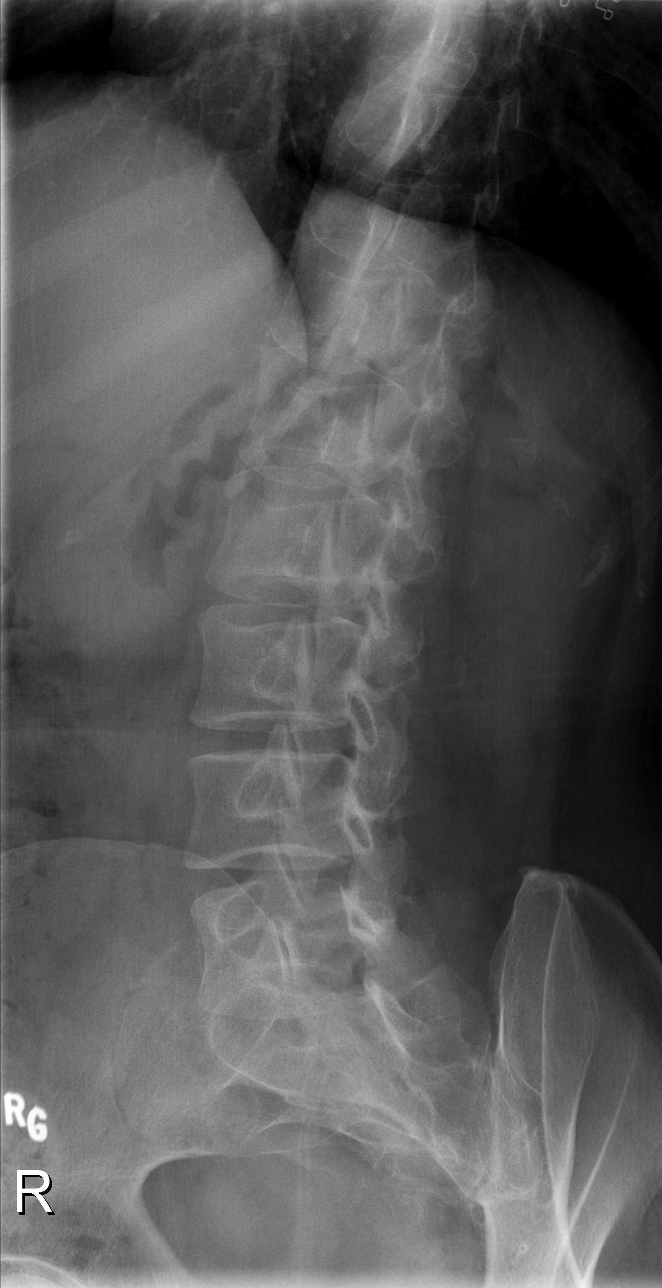

[t l-spine oblique exposure (2 of 2)]
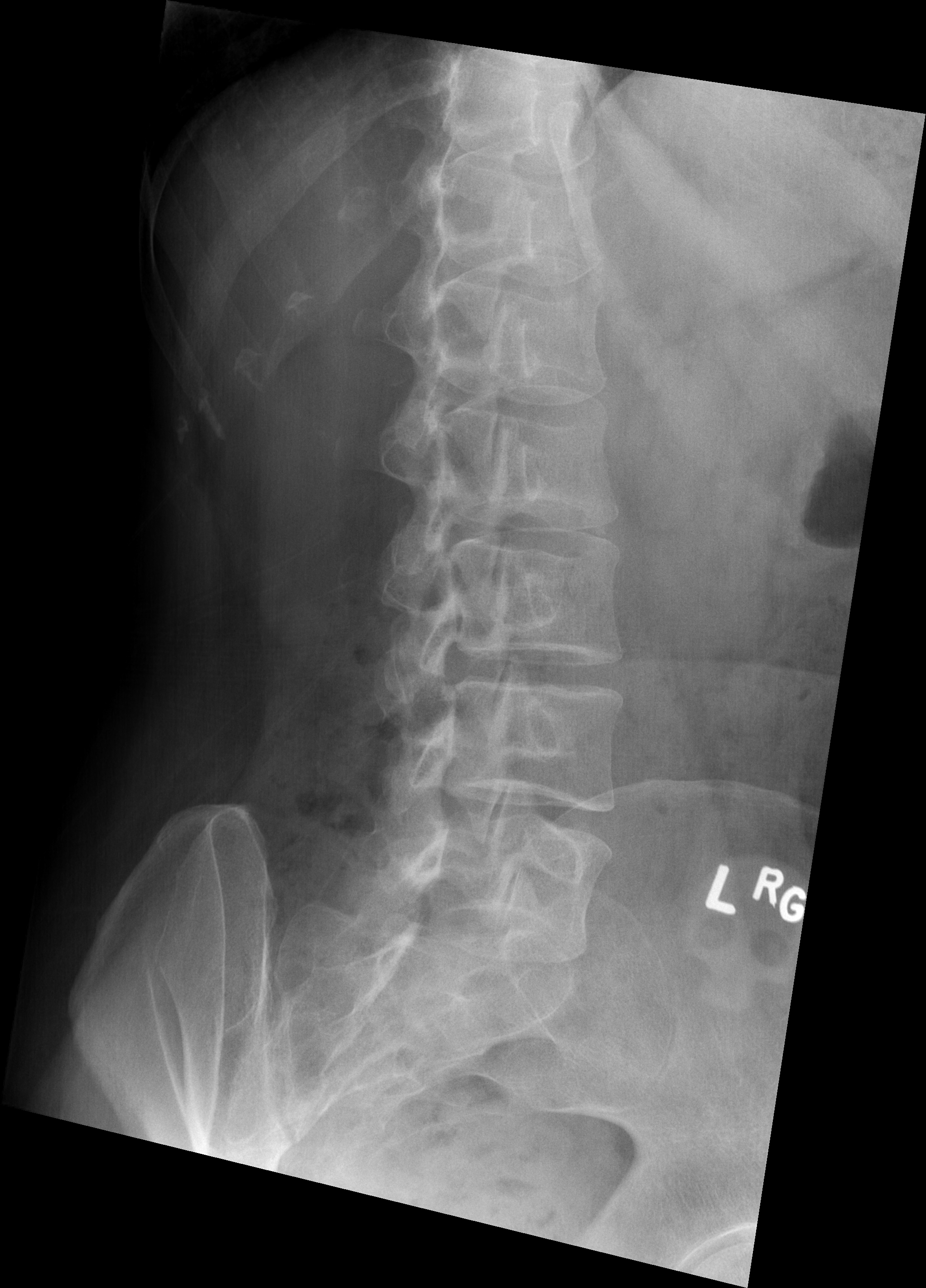

[t l-spine lat]
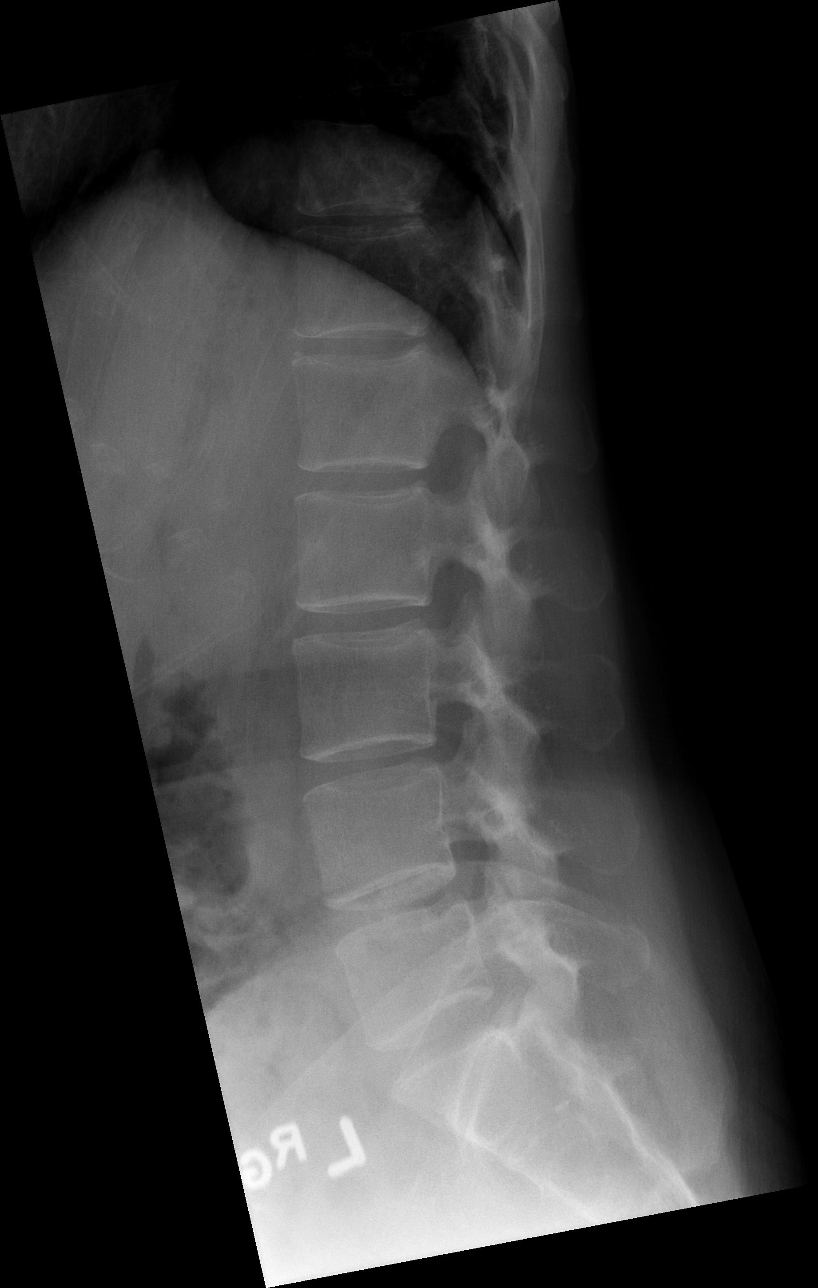

[t l-spine l5-s1 spot]
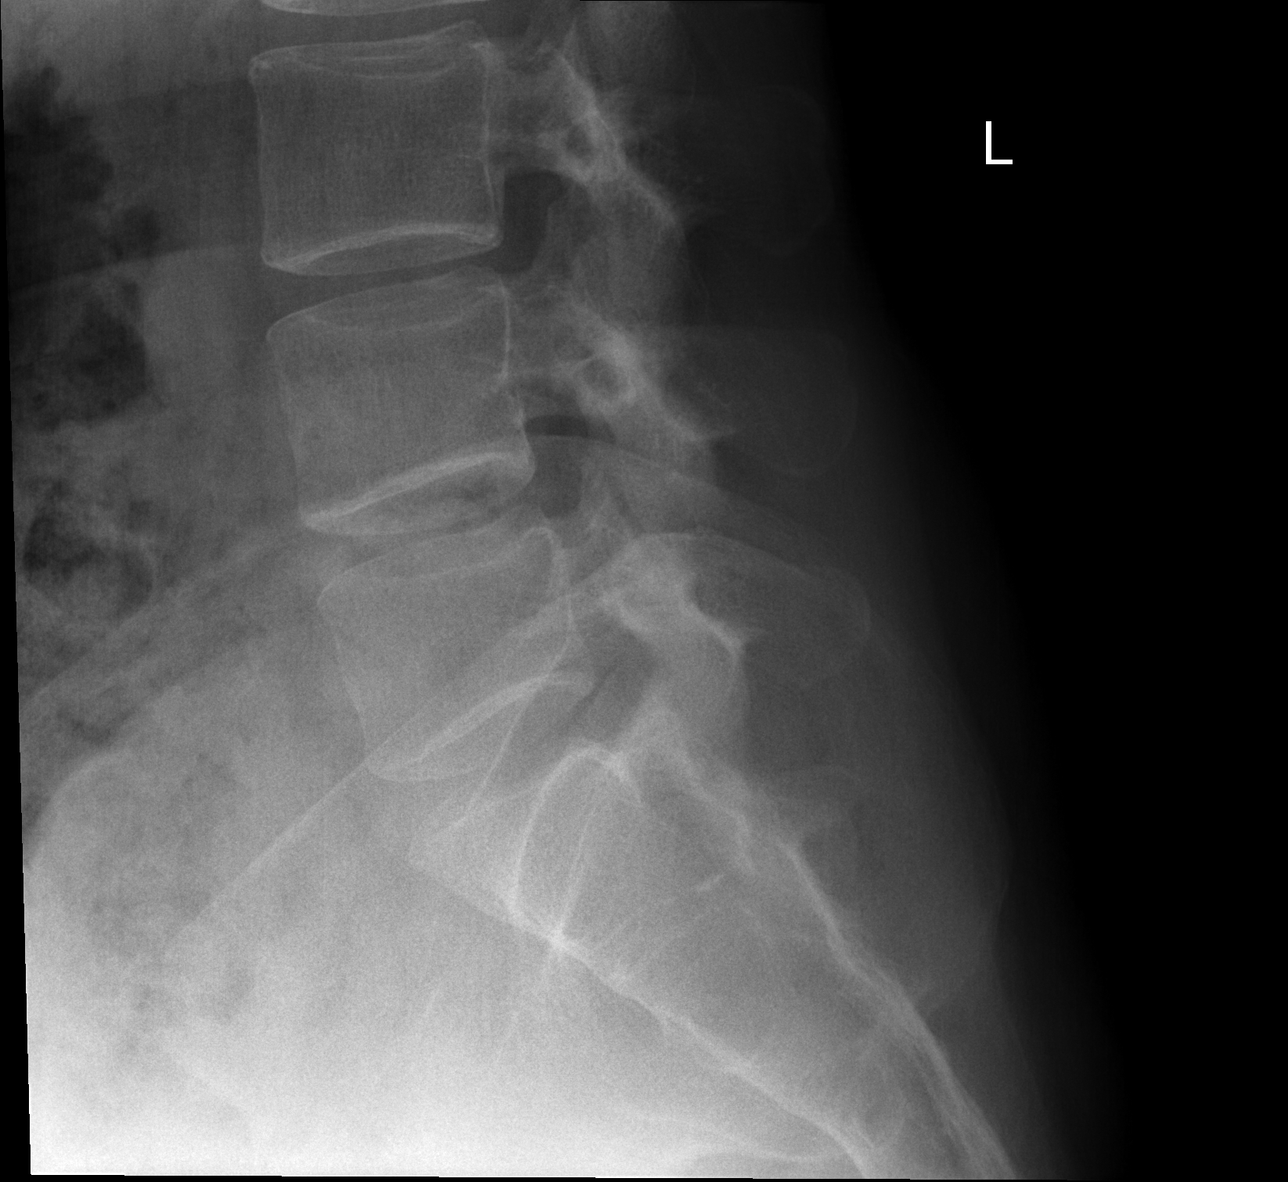

[5 of 5 positions shown; findings below may reference images not displayed]

FINDINGS: There is no evidence of lumbar spine fracture. Alignment is normal.
Intervertebral disc spaces are maintained.
IMPRESSION: Negative.

## 2018-04-15 IMAGING — CR DG SACRUM/COCCYX 2+V
3 series · 3 of 3 positions shown · non-contrast
Comparison: Concurrently obtained radiographs of the lumbar spine

CLINICAL DATA: 37-year-old female with coccyx pain after falling
while cleaning her car yesterday

EXAM:
SACRUM AND COCCYX - 2+ VIEW

[t sacrum lat]
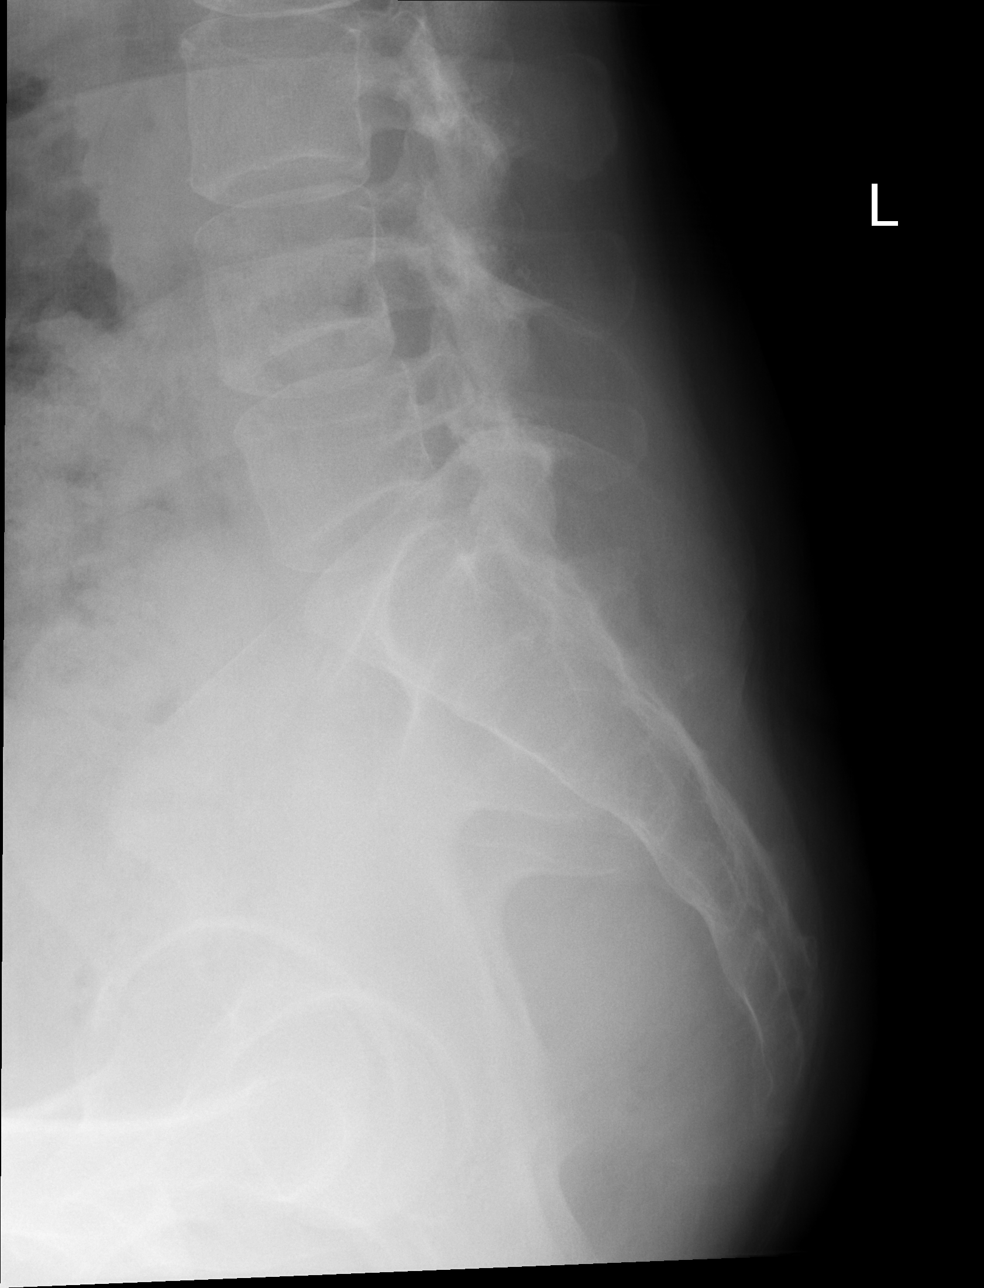

[t sacrum a.p.]
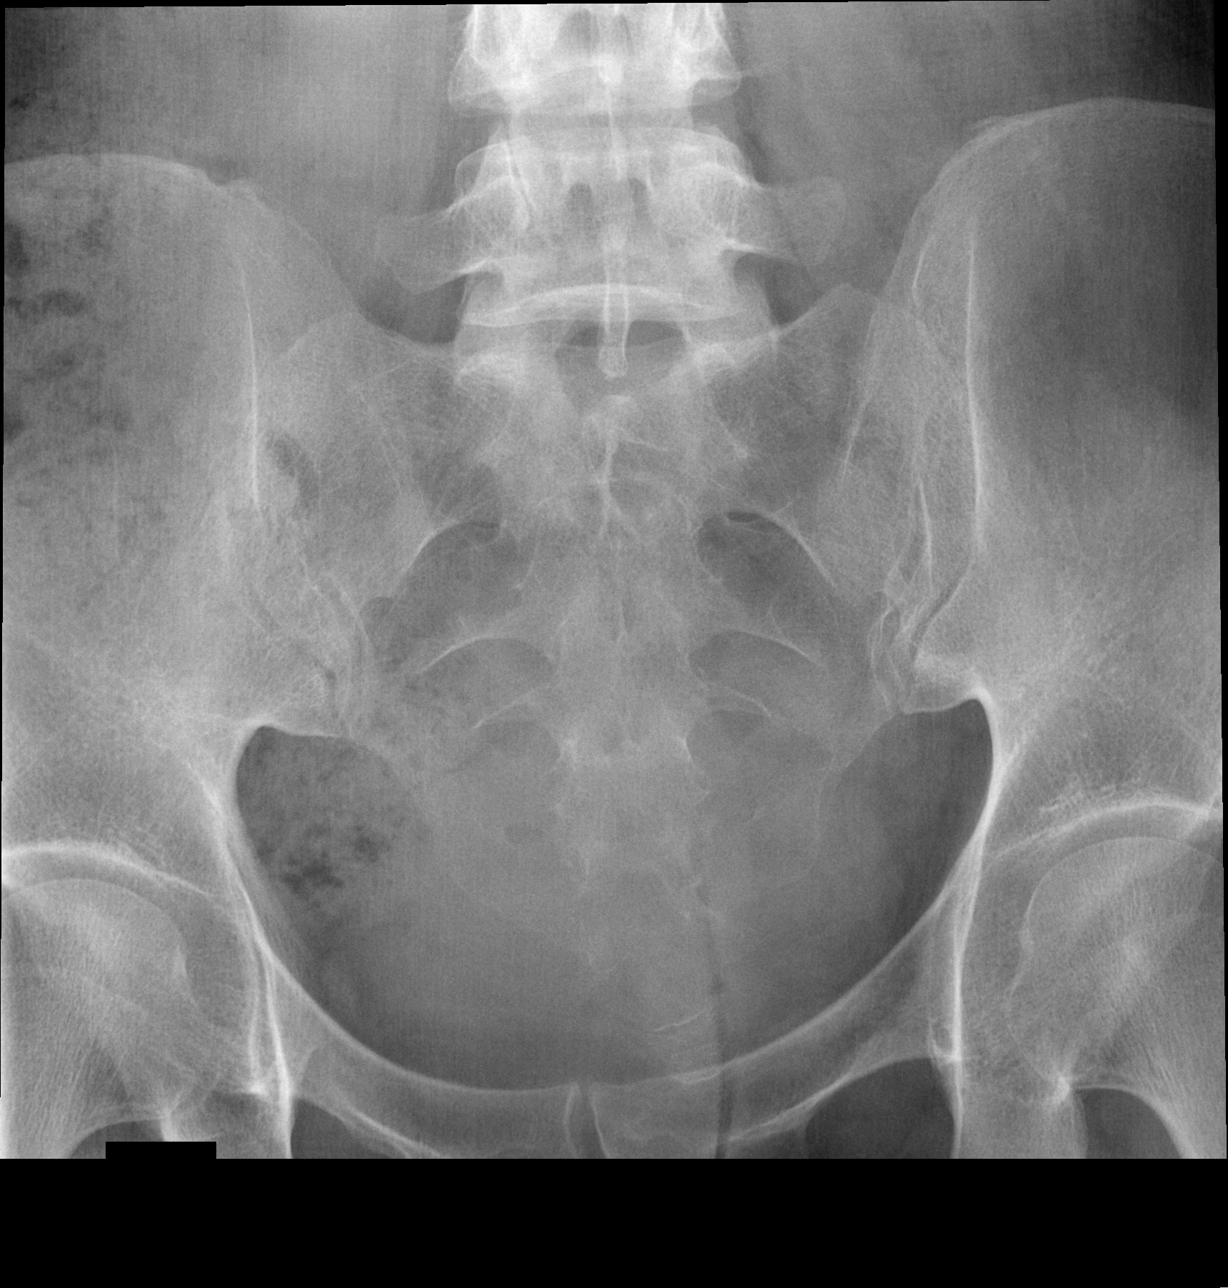

[t coccyx a.p.]
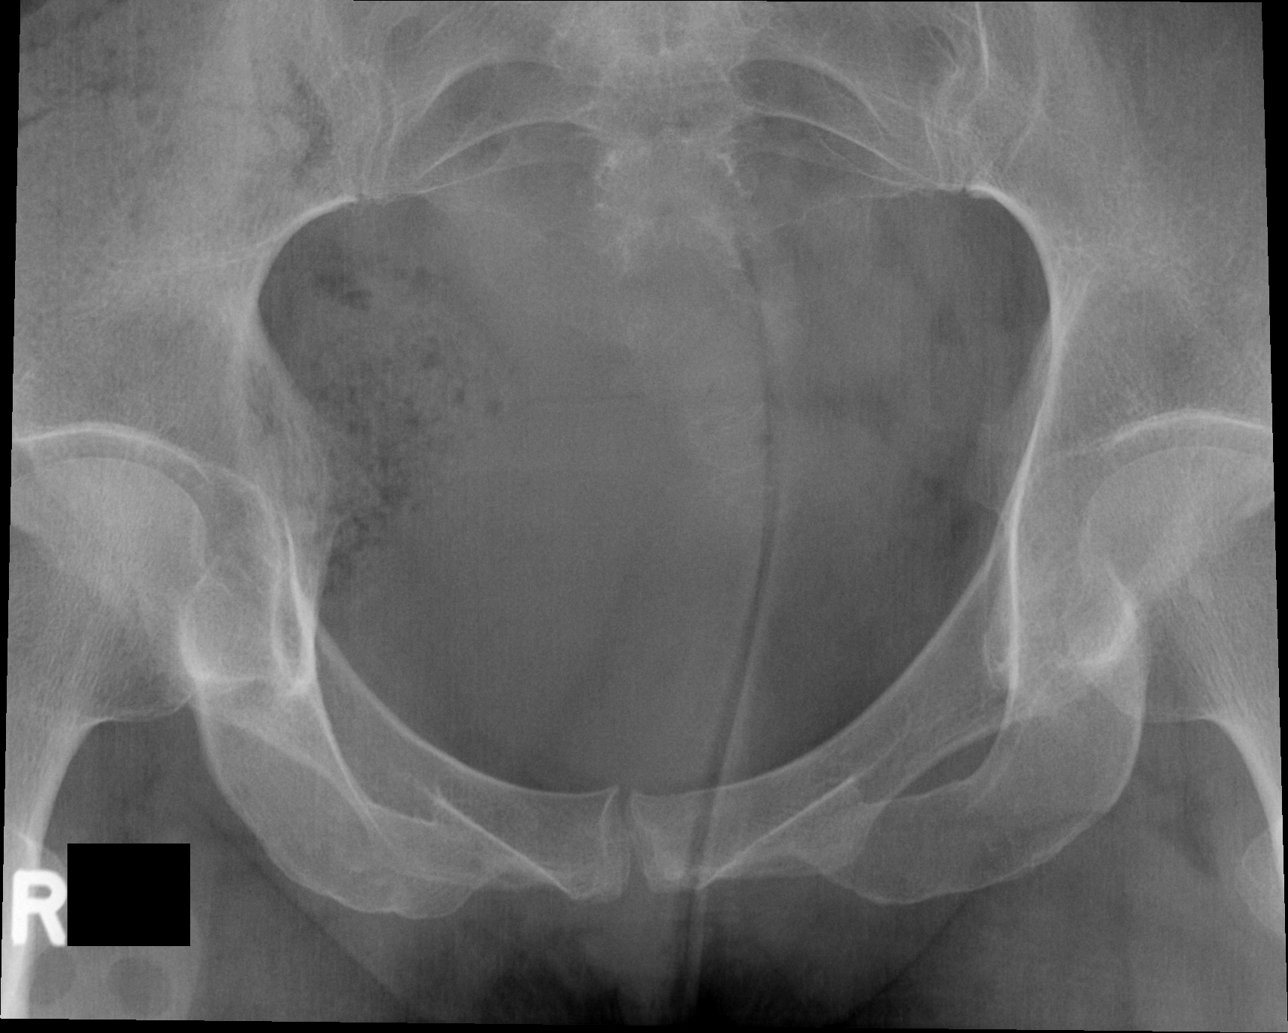

[3 of 3 positions shown; findings below may reference images not displayed]

FINDINGS: There is no evidence of fracture or other focal bone lesions.
IMPRESSION: Negative.

## 2018-04-15 NOTE — ED Triage Notes (Addendum)
Pt fell backwards on a curb yesterday landing on her buttocks. Pt c/o pain to her tailbone and lower back. She Is unable to sit comfortably. Pt had ibuprofen 1 hour PTA

## 2018-04-15 NOTE — ED Notes (Signed)
Patient verbalizes understanding of discharge instructions. Opportunity for questioning and answers were provided. Armband removed by staff, pt discharged from ED.  

## 2018-04-15 NOTE — ED Provider Notes (Signed)
[facility] EMERGENCY DEPARTMENT Provider Note   CSN: [id number] Arrival date & time: 04/15/18  1213     History   Chief Complaint Chief Complaint  Patient presents with  . Fall    HPI Misty Webb is a 38 y.o. female.  HPI  Pt is 38 year old female with history of GERD, seasonal allergies, who presents emergency department today for evaluation after a fall that occurred yesterday.  Patient states that she was stepping backwards out of her car yesterday when she stepped down from a curb and fell onto her buttocks.  Since then she has had constant pain to her lower back and tailbone.  Rates pain as severe.  It is worse when she sits down.  She has tried ibuprofen and warm compresses which have improved her symptoms but have not resolved her pain.  She denies any other injuries from the fall including no head trauma or LOC.  Has been ambulatory.  No numbness or weakness to the lower extremities.  No urinary incontinence or retention.  No bowel incontinence.  Past Medical History:  Diagnosis Date  . Complication of anesthesia    Woke up during endo and tried to pull tube out.  "Easily nauseated"  . Family history of adverse reaction to anesthesia    MOTHER NAUSEA.  FATHER - difficulty waking up  . GERD (gastroesophageal reflux disease)   . Insomnia   . Pneumonia    in her 43s  . Seasonal allergies   . UTI (urinary tract infection)    approx- 1 a year    There are no active problems to display for this patient.   Past Surgical History:  Procedure Laterality Date  . REPAIR EXTENSOR TENDON Left 06/13/2015   Procedure: Reconstruction Extensor Tendon Left Foot;  Surgeon: Nadara Mustard, MD;  Location: [facility] OR;  Service: Orthopedics;  Laterality: Left;  . UPPER GASTROINTESTINAL ENDOSCOPY       OB History   No obstetric history on file.      Home Medications    Prior to Admission medications   Medication Sig Start Date End Date Taking? Authorizing Provider    Biotin w/ Vitamins C & E (HAIR/SKIN/NAILS PO) Take 1 tablet by mouth daily.    [provider]  cetirizine-pseudoephedrine (ZYRTEC-D) 5-120 MG tablet Take 1 tablet by mouth daily.     [provider]  clonazePAM (KLONOPIN) 1 MG tablet Take 0.25-0.5 mg by mouth daily as needed for anxiety.    [provider]  HYDROcodone-acetaminophen (NORCO/VICODIN) 5-325 MG tablet Take 1-2 tablets by mouth every 6 (six) hours as needed. 09/09/17   Law, Waylan Boga, PA-C  ibuprofen (ADVIL,MOTRIN) 800 MG tablet Take 1 tablet (800 mg total) by mouth every 8 (eight) hours as needed for headache. 06/16/15   Charlsie Quest, MD  omeprazole (PRILOSEC) 20 MG capsule Take 20 mg by mouth daily. Reported on 06/16/2015    [provider]  oseltamivir (TAMIFLU) 75 MG capsule Take 1 capsule (75 mg total) by mouth every 12 (twelve) hours. 03/23/18   Sabas Sous, MD  simethicone (MYLICON) 80 MG chewable tablet Chew 80 mg by mouth every 6 (six) hours as needed for flatulence.    [provider]  traMADol (ULTRAM) 50 MG tablet Take 1 tablet (50 mg total) by mouth every 6 (six) hours as needed. Maximum dose= 8 tablets per day 06/13/15   Nadara Mustard, MD  zolpidem (AMBIEN) 10 MG tablet Take 10 mg by mouth  at bedtime as needed for sleep.    [provider]    Family History No family history on file.  Social History Social History   Tobacco Use  . Smoking status: Never Smoker  . Smokeless tobacco: Never Used  Substance Use Topics  . Alcohol use: Yes    Comment: rarely  . Drug use: No     Allergies   Latex and Penicillins   Review of Systems Review of Systems  Constitutional: Negative for fever.  Respiratory: Negative for shortness of breath.   Cardiovascular: Negative for chest pain.  Gastrointestinal: Negative for abdominal pain, blood in stool, constipation, diarrhea, nausea and vomiting.  Genitourinary: Negative for dysuria, flank pain, frequency, hematuria  and urgency.  Musculoskeletal: Positive for back pain. Negative for gait problem.  Skin: Negative for wound.  Neurological: Negative for weakness and numbness.     Physical Exam Updated Vital Signs BP 117/90 (BP Location: Right Arm)   Pulse 72   Temp 98 F (36.7 C) (Oral)   Resp 18   Ht 5\' 5"  (1.651 m)   Wt 52.6 kg   LMP 03/21/2018   SpO2 100%   BMI 19.30 kg/m   Physical Exam Vitals signs and nursing note reviewed.  Constitutional:      General: She is not in acute distress.    Appearance: She is well-developed.  HENT:     Head: Normocephalic and atraumatic.  Eyes:     Conjunctiva/sclera: Conjunctivae normal.  Neck:     Musculoskeletal: Neck supple.  Cardiovascular:     Rate and Rhythm: Normal rate.  Pulmonary:     Effort: Pulmonary effort is normal.  Musculoskeletal: Normal range of motion.     Comments: Tenderness to the midline lumbar spine to the sacrum/coccyx area.  5/5 strength to the bilateral lower extremities.  Normal sensation throughout.  Ambulatory in the room without difficulty.  Skin:    General: Skin is warm and dry.  Neurological:     Mental Status: She is alert.      ED Treatments / Results  Labs (all labs ordered are listed, but only abnormal results are displayed) Labs Reviewed  PREGNANCY, URINE    EKG None  Radiology Dg Lumbar Spine Complete  Result Date: 04/15/2018 CLINICAL DATA:  38 year old female with lumbar spine and coccyx pain after falling while cleaning her car yesterday. EXAM: LUMBAR SPINE - COMPLETE 4+ VIEW COMPARISON:  Concurrently obtained radiographs of the sacrum; prior CT scan of the abdomen and pelvis 12/29/2017 FINDINGS: There is no evidence of lumbar spine fracture. Alignment is normal. Intervertebral disc spaces are maintained. IMPRESSION: Negative. Electronically Signed   By: Malachy MoanHeath  McCullough M.D.   On: 04/15/2018 14:52   Dg Sacrum/coccyx  Result Date: 04/15/2018 CLINICAL DATA:  38 year old female with coccyx pain  after falling while cleaning her car yesterday EXAM: SACRUM AND COCCYX - 2+ VIEW COMPARISON:  Concurrently obtained radiographs of the lumbar spine FINDINGS: There is no evidence of fracture or other focal bone lesions. IMPRESSION: Negative. Electronically Signed   By: Malachy MoanHeath  McCullough M.D.   On: 04/15/2018 14:52    Procedures Procedures (including critical care time)  Medications Ordered in ED Medications - No data to display   Initial Impression / Assessment and Plan / ED Course  I have reviewed the triage vital signs and the nursing notes.  Pertinent labs & imaging results that were available during my care of the patient were reviewed by me and considered in my medical decision making (  see chart for details).     Final Clinical Impressions(s) / ED Diagnoses   Final diagnoses:  Fall, initial encounter  Acute midline low back pain without sciatica   Patient presenting after fall that occurred yesterday.  She fell onto her buttocks.  No head trauma or LOC.  Complaining of lower back and tailbone pain.  Does have midline tenderness on exam.  X-ray of the lumbar spine negative.  X-ray sacrum/coccyx negative.  Patient ambulatory without neuro deficits.  Normal strength of the lower extremities.  No red flag signs or symptoms of back pain.  Advised supportive therapy with Tylenol, Motrin and symptoms.  Advise close follow-up with PCP and return if worse.  She voiced understanding the plan and reasons to return.  All questions answered.  Patient stable for discharge.  ED Discharge Orders    None       Rayne Du 04/15/18 1535    Pricilla Loveless, MD 04/15/18 [phone]

## 2018-04-15 NOTE — ED Notes (Signed)
X-ray-Waiting on preg test

## 2018-04-15 NOTE — ED Notes (Signed)
ED Provider at bedside. 

## 2018-04-15 NOTE — Discharge Instructions (Signed)

## 2018-10-10 ENCOUNTER — Other Ambulatory Visit: Payer: Self-pay

## 2018-10-10 ENCOUNTER — Encounter (HOSPITAL_BASED_OUTPATIENT_CLINIC_OR_DEPARTMENT_OTHER): Payer: Self-pay

## 2018-10-10 ENCOUNTER — Emergency Department (HOSPITAL_BASED_OUTPATIENT_CLINIC_OR_DEPARTMENT_OTHER)
Admission: EM | Admit: 2018-10-10 | Discharge: 2018-10-10 | Disposition: A | Discharge status: Home / Self Care | Payer: 59 | Attending: Emergency Medicine | Admitting: Emergency Medicine

## 2018-10-10 DIAGNOSIS — Z79899 Other long term (current) drug therapy: Secondary | ICD-10-CM | POA: Insufficient documentation

## 2018-10-10 DIAGNOSIS — K529 Noninfective gastroenteritis and colitis, unspecified: Secondary | ICD-10-CM | POA: Diagnosis not present

## 2018-10-10 DIAGNOSIS — R1032 Left lower quadrant pain: Secondary | ICD-10-CM | POA: Diagnosis present

## 2018-10-10 LAB — COMPREHENSIVE METABOLIC PANEL
ALT: 22 U/L (ref 0–44)
AST: 28 U/L (ref 15–41)
Albumin: 3.8 g/dL (ref 3.5–5.0)
Alkaline Phosphatase: 68 U/L (ref 38–126)
Anion gap: 10 (ref 5–15)
BUN: 7 mg/dL (ref 6–20)
CO2: 22 mmol/L (ref 22–32)
Calcium: 8.8 mg/dL — ABNORMAL LOW (ref 8.9–10.3)
Chloride: 105 mmol/L (ref 98–111)
Creatinine, Ser: 0.54 mg/dL (ref 0.44–1.00)
GFR calc Af Amer: >60 mL/min (ref >60)
GFR calc non Af Amer: >60 mL/min (ref >60)
Glucose, Bld: 87 mg/dL (ref 70–99)
Potassium: 3.6 mmol/L (ref 3.5–5.1)
Sodium: 137 mmol/L (ref 135–145)
Total Bilirubin: 0.6 mg/dL (ref 0.3–1.2)
Total Protein: 6.3 g/dL — ABNORMAL LOW (ref 6.5–8.1)

## 2018-10-10 LAB — URINALYSIS, ROUTINE W REFLEX MICROSCOPIC
Bilirubin Urine: NEGATIVE
Glucose, UA: NEGATIVE mg/dL
Hgb urine dipstick: NEGATIVE
Ketones, ur: NEGATIVE mg/dL
Leukocytes,Ua: NEGATIVE
Nitrite: NEGATIVE
Protein, ur: NEGATIVE mg/dL
Specific Gravity, Urine: 1.01 (ref 1.005–1.030)
pH: 7.5 (ref 5.0–8.0)

## 2018-10-10 LAB — CBC WITH DIFFERENTIAL/PLATELET
Abs Immature Granulocytes: 0.03 10*3/uL (ref 0.00–0.07)
Basophils Absolute: 0.1 10*3/uL (ref 0.0–0.1)
Basophils Relative: 1 %
Eosinophils Absolute: 0.1 10*3/uL (ref 0.0–0.5)
Eosinophils Relative: 1 %
HCT: 35.8 % — ABNORMAL LOW (ref 36.0–46.0)
Hemoglobin: 11.3 g/dL — ABNORMAL LOW (ref 12.0–15.0)
Immature Granulocytes: 0 %
Lymphocytes Relative: 18 %
Lymphs Abs: 2 10*3/uL (ref 0.7–4.0)
MCH: 28.6 pg (ref 26.0–34.0)
MCHC: 31.6 g/dL (ref 30.0–36.0)
MCV: 90.6 fL (ref 80.0–100.0)
Monocytes Absolute: 1 10*3/uL (ref 0.1–1.0)
Monocytes Relative: 9 %
Neutro Abs: 7.9 10*3/uL — ABNORMAL HIGH (ref 1.7–7.7)
Neutrophils Relative %: 71 %
Platelets: 295 10*3/uL (ref 150–400)
RBC: 3.95 MIL/uL (ref 3.87–5.11)
RDW: 12.1 % (ref 11.5–15.5)
WBC: 11.1 10*3/uL — ABNORMAL HIGH (ref 4.0–10.5)
nRBC: 0 % (ref 0.0–0.2)

## 2018-10-10 LAB — LIPASE, BLOOD: Lipase: 29 U/L (ref 11–51)

## 2018-10-10 LAB — PREGNANCY, URINE: Preg Test, Ur: NEGATIVE

## 2018-10-10 MED ORDER — PREDNISONE 20 MG PO TABS: 60.0000 mg | ORAL_TABLET | Freq: Every day | ORAL | 0 refills | Status: DC

## 2018-10-10 MED ORDER — CIPROFLOXACIN HCL 500 MG PO TABS: 500.0000 mg | ORAL_TABLET | Freq: Two times a day (BID) | ORAL | 0 refills | Status: AC

## 2018-10-10 MED ORDER — METRONIDAZOLE 500 MG PO TABS
500.0000 mg | ORAL_TABLET | Freq: Once | ORAL | Status: AC
  Administered 2018-10-10: 23:00:00 500 mg via ORAL
  Filled 2018-10-10: qty 1

## 2018-10-10 MED ORDER — METRONIDAZOLE 500 MG PO TABS: 500.0000 mg | ORAL_TABLET | Freq: Two times a day (BID) | ORAL | 0 refills | Status: AC

## 2018-10-10 NOTE — ED Notes (Signed)
Patient verbalizes understanding of discharge instructions. Opportunity for questioning and answers were provided. Armband removed by staff, pt discharged from ED.  

## 2018-10-10 NOTE — ED Provider Notes (Signed)
[facility] EMERGENCY DEPARTMENT Provider Note   CSN: [id number] Arrival date & time: 10/10/18  2016    History   Chief Complaint Chief Complaint  Patient presents with  . Abdominal Pain    HPI Misty Webb is a 38 y.o. female.     The history is provided by the patient and medical records. No language interpreter was used.  Abdominal Pain  Misty Webb is a 38 y.o. female who presents to the Emergency Department complaining of abdominal pain.  She presents for evaluation of progressive abdominal pain that began last night.  Pain is located in the LLQ and lower abdomen.  Today she developed progressive bloating, multiple loose stools.  She had a temperature to 100.2 at home.  Sxs are similar to prior episode of colitis that resulted in hospitalization in October of last year.  No known COVID19 exposures.  No current N/V.   Past Medical History:  Diagnosis Date  . Complication of anesthesia    Woke up during endo and tried to pull tube out.  "Easily nauseated"  . Family history of adverse reaction to anesthesia    MOTHER NAUSEA.  FATHER - difficulty waking up  . GERD (gastroesophageal reflux disease)   . Insomnia   . Pneumonia    in her 4720s  . Seasonal allergies   . UTI (urinary tract infection)    approx- 1 a year    There are no active problems to display for this patient.   Past Surgical History:  Procedure Laterality Date  . REPAIR EXTENSOR TENDON Left 06/13/2015   Procedure: Reconstruction Extensor Tendon Left Foot;  Surgeon: Nadara MustardMarcus Duda V, MD;  [facility];  Service: Orthopedics;  Laterality: Left;  . UPPER GASTROINTESTINAL ENDOSCOPY       OB History   No obstetric history on file.      Home Medications    Prior to Admission medications   Medication Sig Start Date End Date Taking? Authorizing Provider  Biotin w/ Vitamins C & E (HAIR/SKIN/NAILS PO) Take 1 tablet by mouth daily.   Yes [provider]  cetirizine-pseudoephedrine  (ZYRTEC-D) 5-120 MG tablet Take 1 tablet by mouth daily.    Yes [provider]  clonazePAM (KLONOPIN) 1 MG tablet Take 0.25-0.5 mg by mouth daily as needed for anxiety.   Yes [provider]  ibuprofen (ADVIL,MOTRIN) 800 MG tablet Take 1 tablet (800 mg total) by mouth every 8 (eight) hours as needed for headache. 06/16/15  Yes Charlsie QuestPatel, Vishal R, MD  omeprazole (PRILOSEC) 20 MG capsule Take 20 mg by mouth daily. Reported on 06/16/2015   Yes [provider]  zolpidem (AMBIEN) 10 MG tablet Take 10 mg by mouth at bedtime as needed for sleep.   Yes [provider]  ciprofloxacin (CIPRO) 500 MG tablet Take 1 tablet (500 mg total) by mouth 2 (two) times daily. 10/10/18   Tilden Fossaees, Brynn Mulgrew, MD  metroNIDAZOLE (FLAGYL) 500 MG tablet Take 1 tablet (500 mg total) by mouth 2 (two) times daily. 10/10/18   Tilden Fossaees, Chancelor Hardrick, MD  predniSONE (DELTASONE) 20 MG tablet Take 3 tablets (60 mg total) by mouth daily. 10/10/18   Tilden Fossaees, Anevay Campanella, MD    Family History No family history on file.  Social History Social History   Tobacco Use  . Smoking status: Never Smoker  . Smokeless tobacco: Never Used  Substance Use Topics  . Alcohol use: Yes    Comment: rarely  . Drug use: No  Allergies   Latex and Penicillins   Review of Systems Review of Systems  Gastrointestinal: Positive for abdominal pain.  All other systems reviewed and are negative.    Physical Exam Updated Vital Signs BP 101/76 (BP Location: Right Arm)   Pulse 77   Temp 99.1 F (37.3 C) (Oral)   Resp 16   Ht 5\' 5"  (1.651 m)   Wt 54.4 kg   LMP 10/02/2018   SpO2 100%   BMI 19.97 kg/m   Physical Exam Vitals signs and nursing note reviewed.  Constitutional:      Appearance: She is well-developed.  HENT:     Head: Normocephalic and atraumatic.  Cardiovascular:     Rate and Rhythm: Normal rate and regular rhythm.     Heart sounds: No murmur.  Pulmonary:     Effort: Pulmonary effort is normal. No  respiratory distress.     Breath sounds: Normal breath sounds.  Abdominal:     Palpations: Abdomen is soft.     Tenderness: There is no guarding or rebound.     Comments: Moderate lower abdominal and llq tenderness.    Musculoskeletal:        General: No swelling or tenderness.  Skin:    General: Skin is warm and dry.  Neurological:     Mental Status: She is alert and oriented to person, place, and time.  Psychiatric:        Mood and Affect: Mood normal.        Behavior: Behavior normal.      ED Treatments / Results  Labs (all labs ordered are listed, but only abnormal results are displayed) Labs Reviewed  COMPREHENSIVE METABOLIC PANEL - Abnormal; Notable for the following components:      Result Value   Calcium 8.8 (*)    Total Protein 6.3 (*)    All other components within normal limits  CBC WITH DIFFERENTIAL/PLATELET - Abnormal; Notable for the following components:   WBC 11.1 (*)    Hemoglobin 11.3 (*)    HCT 35.8 (*)    Neutro Abs 7.9 (*)    All other components within normal limits  URINALYSIS, ROUTINE W REFLEX MICROSCOPIC  PREGNANCY, URINE  LIPASE, BLOOD    EKG None  Radiology No results found.  Procedures Procedures (including critical care time)  Medications Ordered in ED Medications  metroNIDAZOLE (FLAGYL) tablet 500 mg (500 mg Oral Given 10/10/18 2232)     Initial Impression / Assessment and Plan / ED Course  I have reviewed the triage vital signs and the nursing notes.  Pertinent labs & imaging results that were available during my care of the patient were reviewed by me and considered in my medical decision making (see chart for details).        Patient here for evaluation abdominal pain, bloating and loose stools. She has non-toxic appearing on evaluation of well hydrated. She has no peritoneal findings. She did have a similar episode in the past related to colitis. Labs demonstrate mild leukocytosis. Reviewed records and care everywhere. She  was previously treated with Cipro, Flagyl and prednisone with resolution of her symptoms. Discussed with patient will provide treatment with antibiotics and steroids. Discussed importance of gastroenterology follow-up as well as return precautions if she has any progressive or new concerning symptoms. Presentation is not consistent with profound dehydration, intra-abdominal abscess.  Final Clinical Impressions(s) / ED Diagnoses   Final diagnoses:  Colitis    ED Discharge Orders  Ordered    ciprofloxacin (CIPRO) 500 MG tablet  2 times daily     10/10/18 2227    metroNIDAZOLE (FLAGYL) 500 MG tablet  2 times daily     10/10/18 2227    predniSONE (DELTASONE) 20 MG tablet  Daily     10/10/18 2227           Tilden Fossaees, Alise Calais, MD 10/11/18 0001

## 2018-10-10 NOTE — ED Triage Notes (Signed)
Pt c/o abd pain started yesterday-states feels like when she was dx with gastritis-NAD-steady gait

## 2021-06-25 ENCOUNTER — Emergency Department (HOSPITAL_BASED_OUTPATIENT_CLINIC_OR_DEPARTMENT_OTHER)
Admission: EM | Admit: 2021-06-25 | Discharge: 2021-06-25 | Disposition: A | Discharge status: Home / Self Care | Payer: 59 | Attending: Emergency Medicine | Admitting: Emergency Medicine

## 2021-06-25 ENCOUNTER — Emergency Department (HOSPITAL_COMMUNITY): Payer: 59

## 2021-06-25 ENCOUNTER — Encounter (HOSPITAL_BASED_OUTPATIENT_CLINIC_OR_DEPARTMENT_OTHER): Payer: Self-pay

## 2021-06-25 ENCOUNTER — Other Ambulatory Visit: Payer: Self-pay

## 2021-06-25 DIAGNOSIS — H538 Other visual disturbances: Secondary | ICD-10-CM | POA: Insufficient documentation

## 2021-06-25 DIAGNOSIS — Z79899 Other long term (current) drug therapy: Secondary | ICD-10-CM | POA: Insufficient documentation

## 2021-06-25 DIAGNOSIS — Z91040 Latex allergy status: Secondary | ICD-10-CM | POA: Diagnosis not present

## 2021-06-25 DIAGNOSIS — R200 Anesthesia of skin: Secondary | ICD-10-CM | POA: Diagnosis not present

## 2021-06-25 DIAGNOSIS — H539 Unspecified visual disturbance: Secondary | ICD-10-CM

## 2021-06-25 HISTORY — DX: Anxiety disorder, unspecified: F41.9

## 2021-06-25 LAB — COMPREHENSIVE METABOLIC PANEL
ALT: 16 U/L (ref 0–44)
AST: 16 U/L (ref 15–41)
Albumin: 4.7 g/dL (ref 3.5–5.0)
Alkaline Phosphatase: 69 U/L (ref 38–126)
Anion gap: 8 (ref 5–15)
BUN: 9 mg/dL (ref 6–20)
CO2: 28 mmol/L (ref 22–32)
Calcium: 10.1 mg/dL (ref 8.9–10.3)
Chloride: 104 mmol/L (ref 98–111)
Creatinine, Ser: 0.6 mg/dL (ref 0.44–1.00)
GFR, Estimated: >60 mL/min (ref >60)
Glucose, Bld: 86 mg/dL (ref 70–99)
Potassium: 4.2 mmol/L (ref 3.5–5.1)
Sodium: 140 mmol/L (ref 135–145)
Total Bilirubin: 0.6 mg/dL (ref 0.3–1.2)
Total Protein: 7.2 g/dL (ref 6.5–8.1)

## 2021-06-25 LAB — CBC WITH DIFFERENTIAL/PLATELET
Abs Immature Granulocytes: 0.01 10*3/uL (ref 0.00–0.07)
Basophils Absolute: 0 10*3/uL (ref 0.0–0.1)
Basophils Relative: 1 %
Eosinophils Absolute: 0.1 10*3/uL (ref 0.0–0.5)
Eosinophils Relative: 1 %
HCT: 44.1 % (ref 36.0–46.0)
Hemoglobin: 14.9 g/dL (ref 12.0–15.0)
Immature Granulocytes: 0 %
Lymphocytes Relative: 27 %
Lymphs Abs: 1.4 10*3/uL (ref 0.7–4.0)
MCH: 31.6 pg (ref 26.0–34.0)
MCHC: 33.8 g/dL (ref 30.0–36.0)
MCV: 93.4 fL (ref 80.0–100.0)
Monocytes Absolute: 0.4 10*3/uL (ref 0.1–1.0)
Monocytes Relative: 8 %
Neutro Abs: 3.2 10*3/uL (ref 1.7–7.7)
Neutrophils Relative %: 63 %
Platelets: 273 10*3/uL (ref 150–400)
RBC: 4.72 MIL/uL (ref 3.87–5.11)
RDW: 11.6 % (ref 11.5–15.5)
WBC: 5.1 10*3/uL (ref 4.0–10.5)
nRBC: 0 % (ref 0.0–0.2)

## 2021-06-25 IMAGING — MR MR CERVICAL SPINE WO/W CM
4 of 8 series · 18 of 48 positions shown · IV contrast (gadavist)
Comparison: Concurrently performed MRI head and MRI orbits
[DATE].

CLINICAL DATA: Provided history: Right eye vision blurriness, right
eye pain, right arm numbness.

EXAM:
MRI CERVICAL SPINE WITHOUT AND WITH CONTRAST
TECHNIQUE: Multiplanar and multiecho pulse sequences of the cervical spine, to
include the craniocervical junction and cervicothoracic junction,
were obtained without and with intravenous contrast.
CONTRAST:  5mL GADAVIST GADOBUTROL 1 MMOL/ML IV SOLN

[Series 14: T2 · sagittal · 3.0mm · 0.43mm/px · 4 of 18 slices shown (1 of 2)]
[im 1/18]
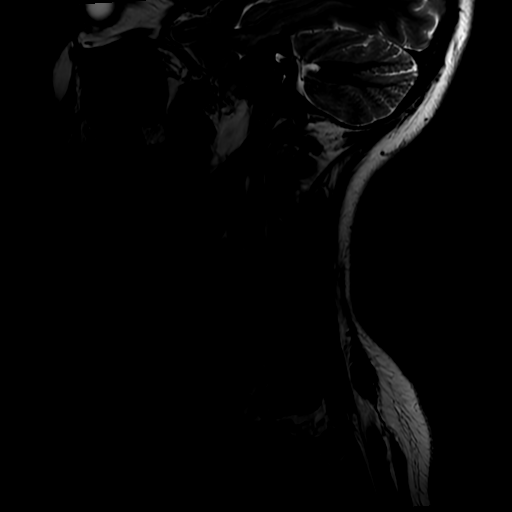
[im 6/18]
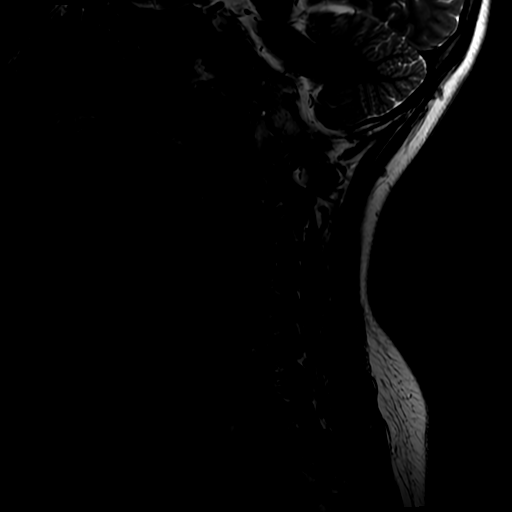
[im 12/18]
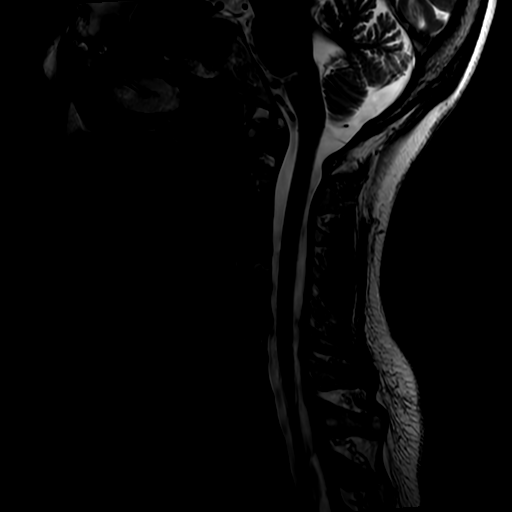
[im 18/18]
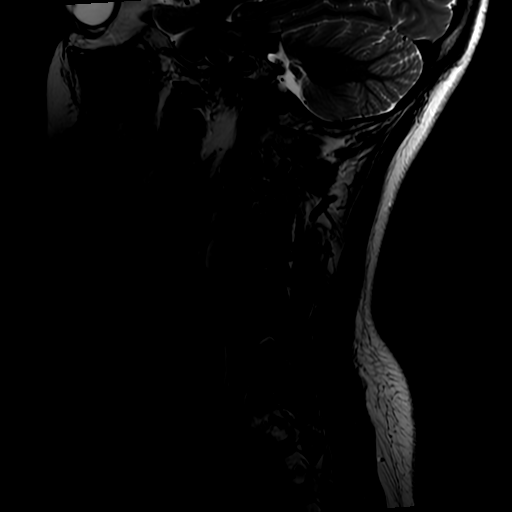

[Series 18: T2 · axial · 3.0mm · 0.35mm/px · z∈[-246,-146]mm · 6 of 31 slices shown (2 of 2)]
[im 1/31]
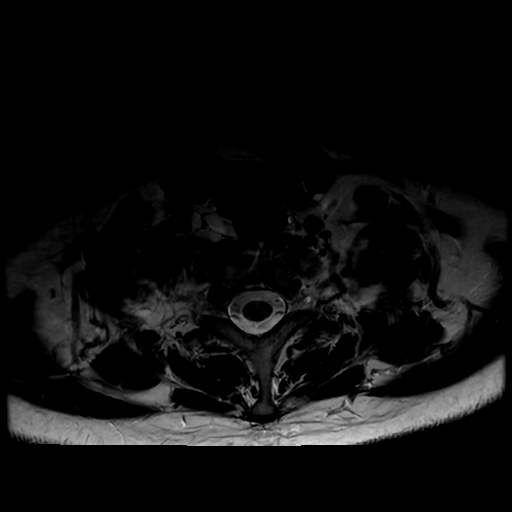
[im 7/31]
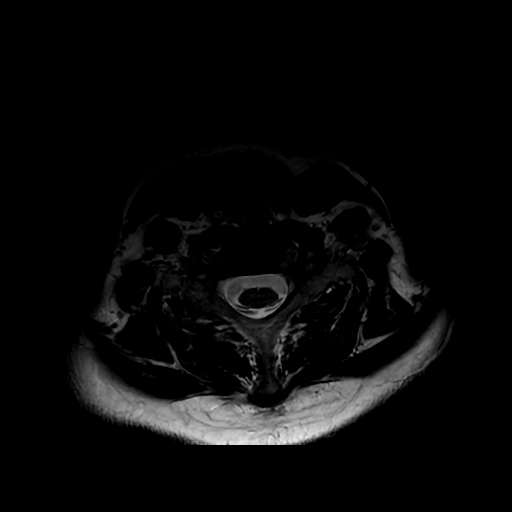
[im 13/31]
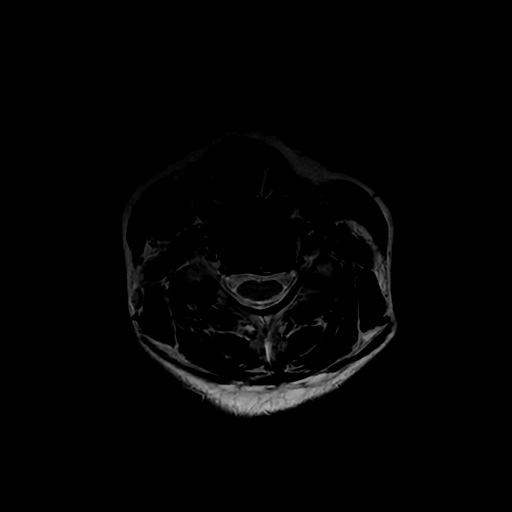
[im 19/31]
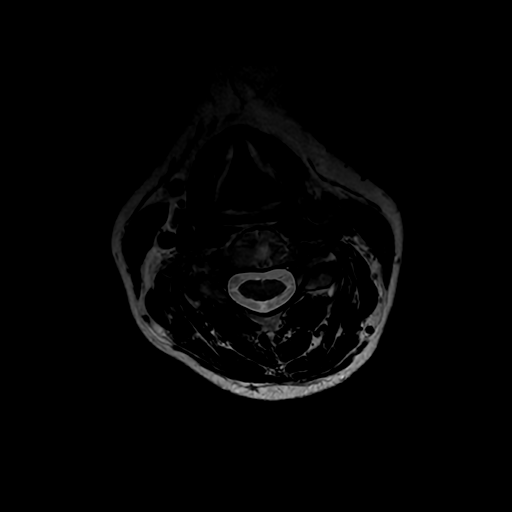
[im 25/31]
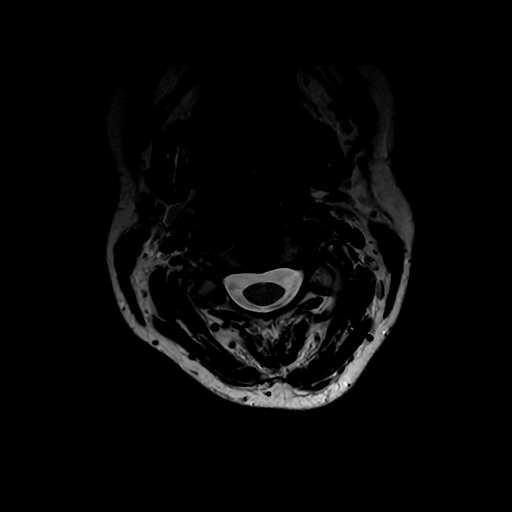
[im 31/31]
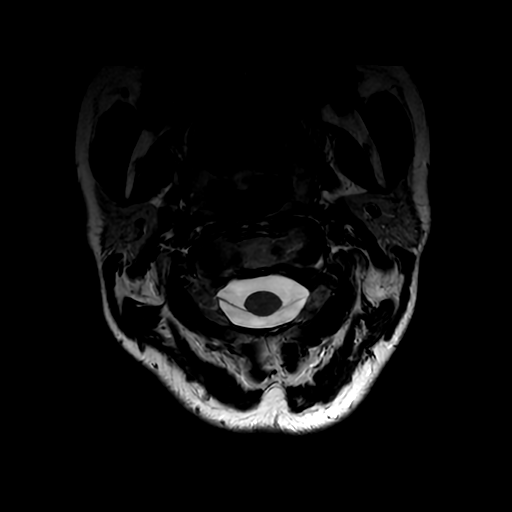

[Series 19: T1 · axial · non-contrast · 3.0mm · 0.35mm/px · z∈[-246,-146]mm · 5 of 31 slices shown]
[im 1/31]
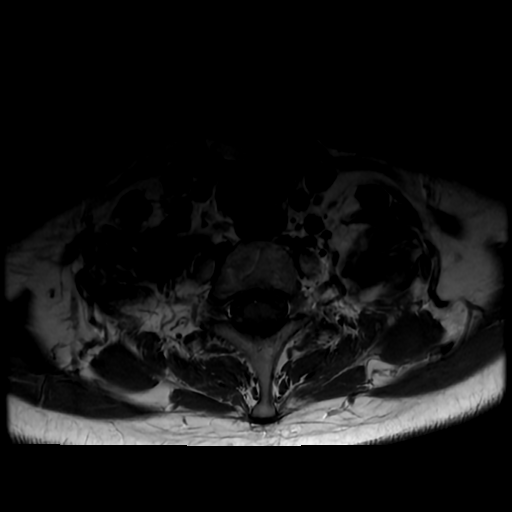
[im 7/31]
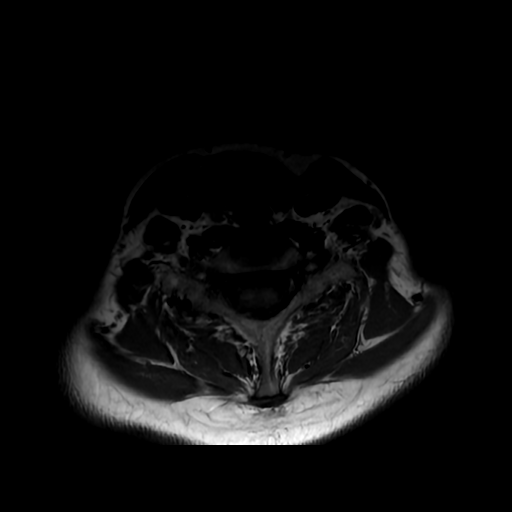
[im 13/31]
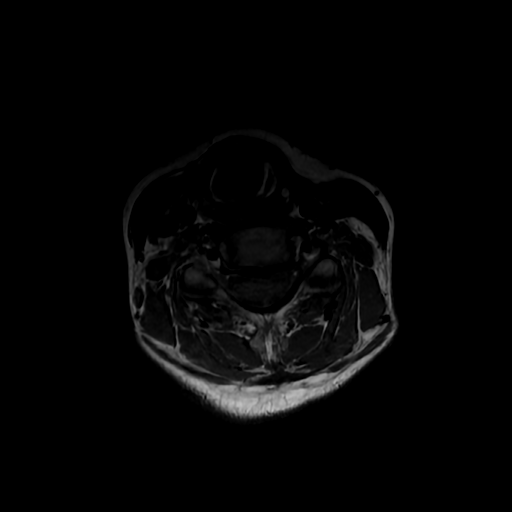
[im 19/31]
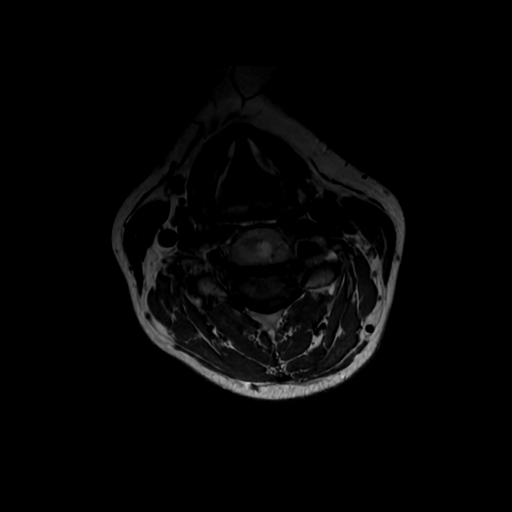
[im 31/31]
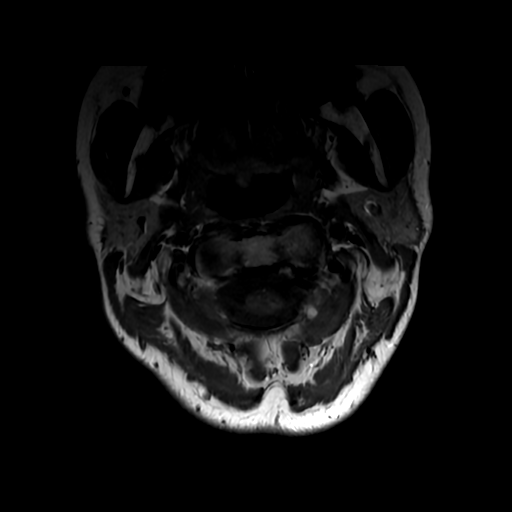

[Series 26: T1 fat-sat post-contrast · sagittal · 3.0mm · 0.43mm/px · 3 of 18 slices shown]
[im 1/18]
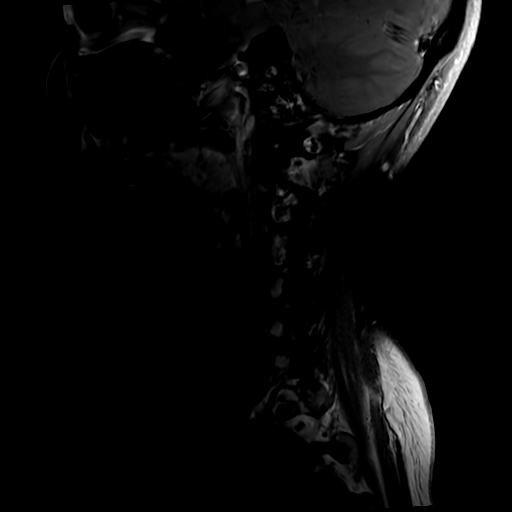
[im 9/18]
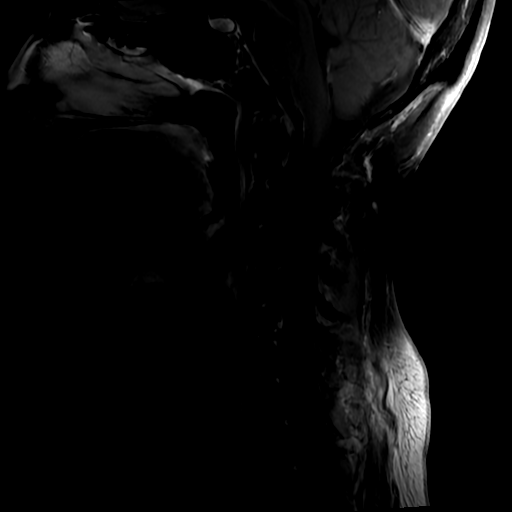
[im 18/18]
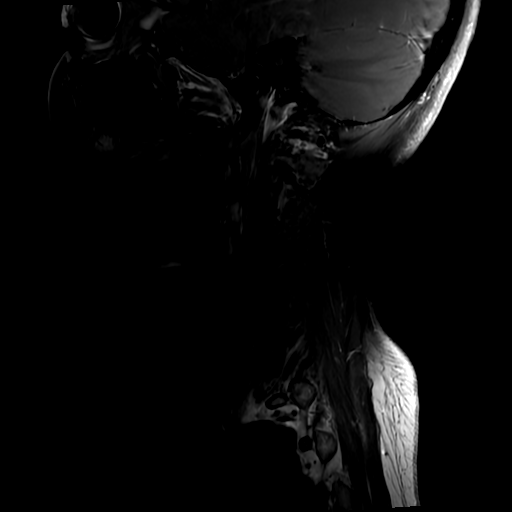

[18 of 48 positions shown; findings below may reference images not displayed]

FINDINGS: Alignment: Straightening of the expected cervical lordosis. No
significant spondylolisthesis.

Vertebrae: Vertebral body height is maintained. No significant
marrow edema or focal suspicious osseous lesion.

Cord: No signal abnormality identified within the cervical spinal
cord. No pathologic spinal cord enhancement.

Posterior Fossa, vertebral arteries, paraspinal tissues:
Intracranial contents better assessed on concurrently performed
brain MRI. Flow voids preserved within the imaged cervical vertebral
arteries. Paraspinal soft tissues unremarkable. 9 mm T2 hypointense
nodule within the right thyroid lobe, not meeting consensus criteria
for ultrasound follow-up based on size.

Disc levels:

Mild multilevel disc degeneration, greatest at C5-C6.

C2-C3: No significant disc herniation or stenosis.

C3-C4: No significant disc herniation or stenosis.

C4-C5: No significant disc herniation or stenosis.

C5-C6: Small left center disc protrusion at site of posterior
annular fissure. The disc protrusion results in mild focal
effacement of the ventral thecal sac (without spinal cord mass
effect). No significant foraminal stenosis.

C6-C7: No significant disc herniation or stenosis.

C7-T1: No significant disc herniation or stenosis.
IMPRESSION: No signal abnormality identified within the cervical spinal cord. No
pathologic spinal cord enhancement.

Mild cervical spondylosis, as outlined. Most notably at C5-C6, there
is a small left center disc protrusion at site of posterior annular
fissure. The disc protrusion results in mild focal effacement of the
ventral thecal sac (without spinal cord mass effect).

Nonspecific straightening of the expected cervical lordosis.

## 2021-06-25 IMAGING — MR MR HEAD WO/W CM
8 of 26 series · 20 of 48 positions shown · IV contrast (gadavist)
Comparison: CT angiogram head/neck [DATE].  Head CT [DATE].

CLINICAL DATA: Vision loss, monocular; right eye vision blurriness,
right eye pain, right arm numbness. Possible optic neuritis.

EXAM:
MRI HEAD AND ORBITS WITHOUT AND WITH CONTRAST
TECHNIQUE: Multiplanar, multiecho pulse sequences of the brain and surrounding
structures were obtained without and with intravenous contrast.
Multiplanar, multiecho pulse sequences of the orbits and surrounding
structures were obtained including fat saturation techniques, before
and after intravenous contrast administration.
CONTRAST:  5mL GADAVIST GADOBUTROL 1 MMOL/ML IV SOLN

[Series 2: DWI · axial · 3.0mm · 0.94mm/px · z∈[-129,+15]mm · 6 of 100 slices shown (1 of 2)]
[im 1/100]
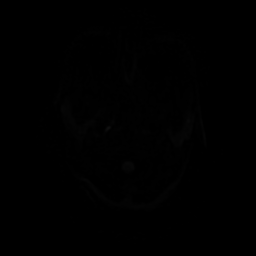
[im 20/100]
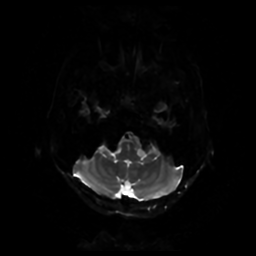
[im 40/100]
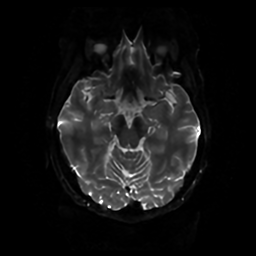
[im 60/100]
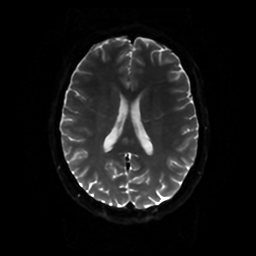
[im 80/100]
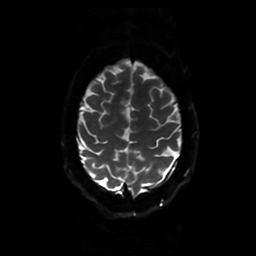
[im 100/100]
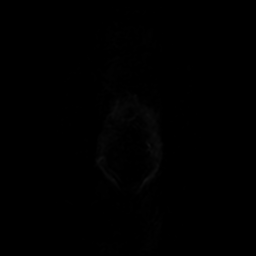

[Series 3: DWI · coronal · 4.0mm · 0.94mm/px · 4 of 74 slices shown (2 of 2)]
[im 1/74]
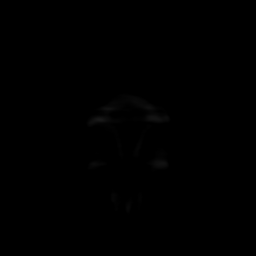
[im 25/74]
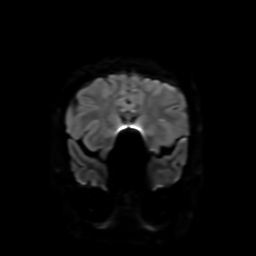
[im 49/74]
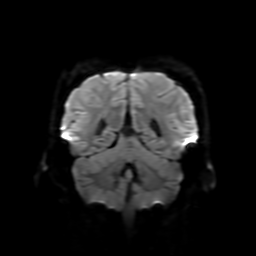
[im 74/74]
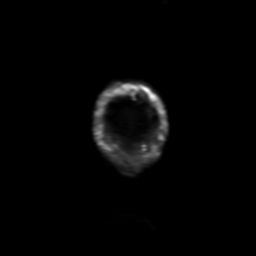

[Series 4: FLAIR · sagittal · 5.0mm · 0.23mm/px · 2 of 23 slices shown (1 of 3)]
[im 1/23]
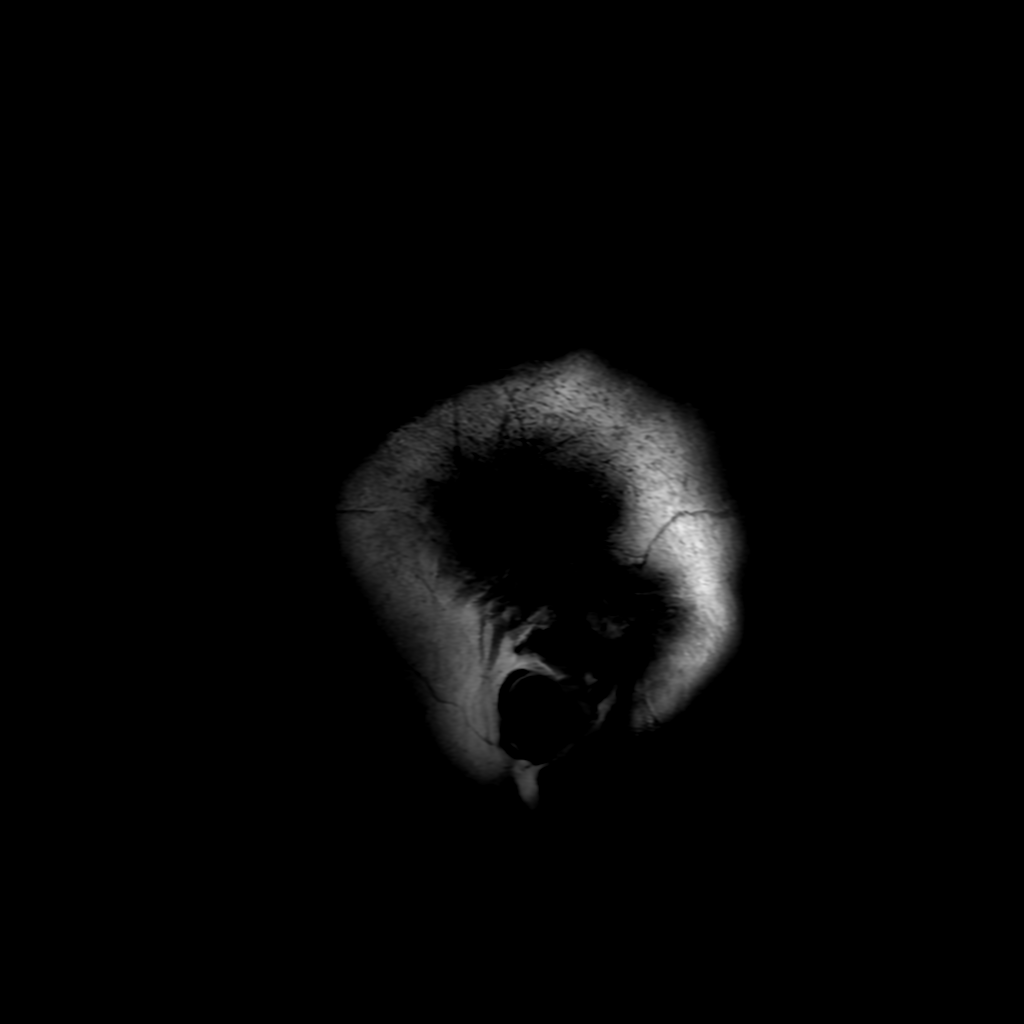
[im 23/23]
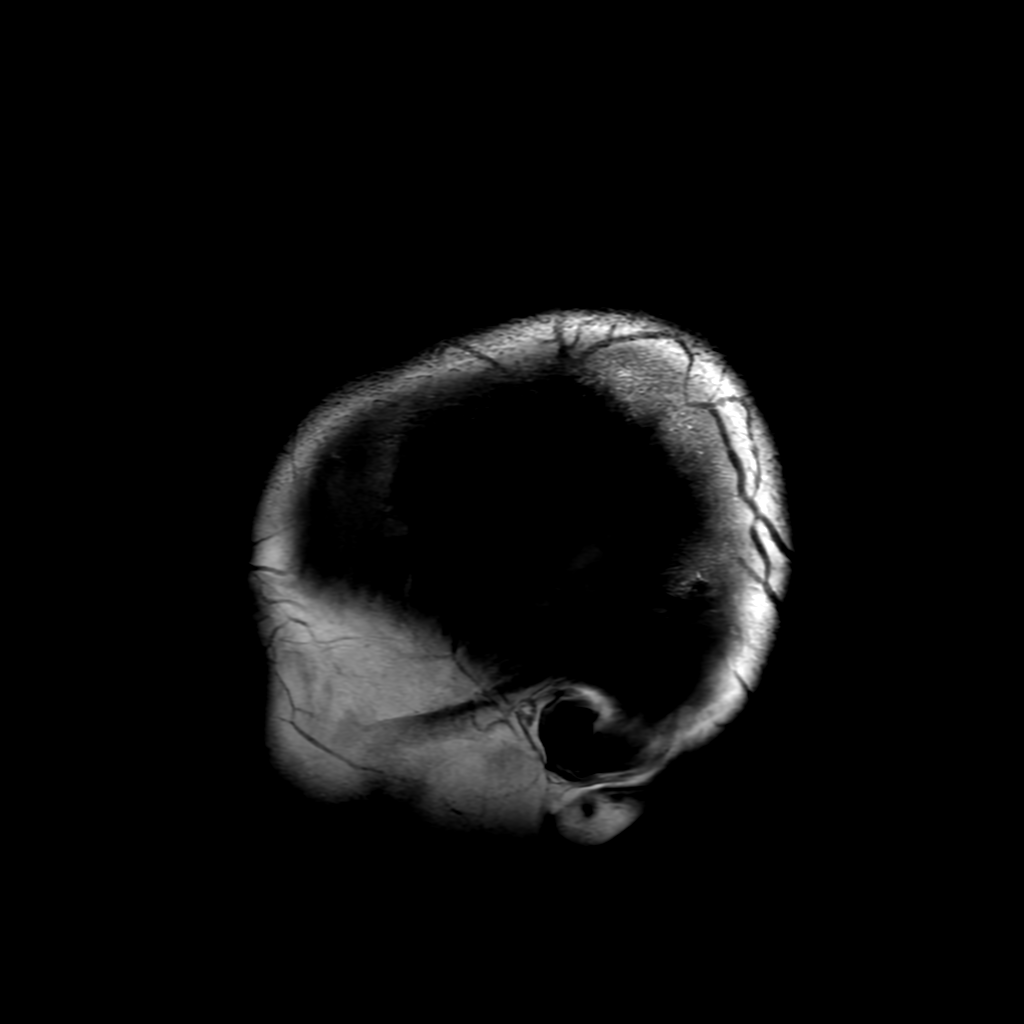

[Series 6: FLAIR · axial · 4.0mm · 0.47mm/px · z∈[-128,+14]mm · 2 of 34 slices shown (2 of 3)]
[im 1/34]
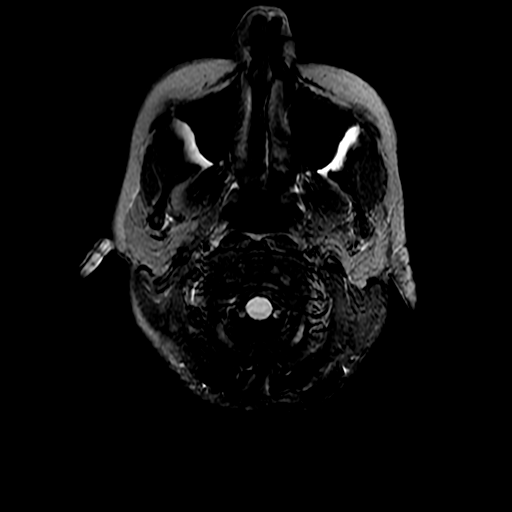
[im 34/34]
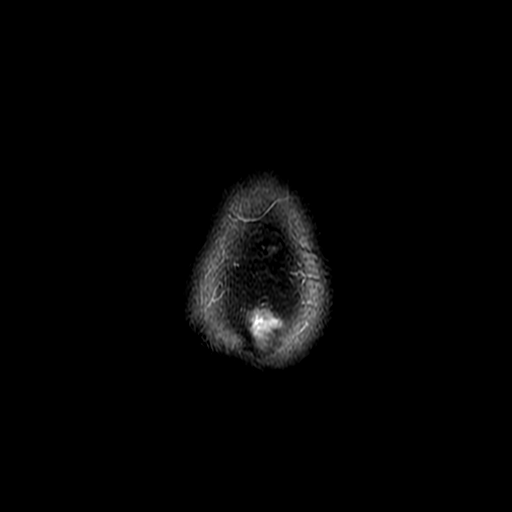

[Series 15: FLAIR · sagittal · 3.0mm · 0.43mm/px · 1 of 18 slices shown (3 of 3)]
[im 1/18]
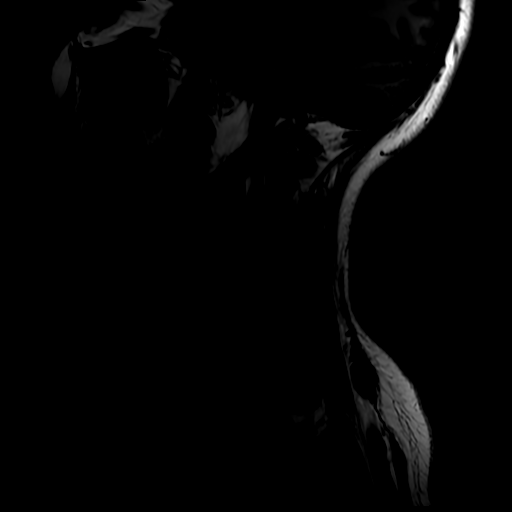

[Series 27: T1 post-contrast · axial · 3.0mm · 0.35mm/px · 1 of 31 slices shown]
[im 1/31]
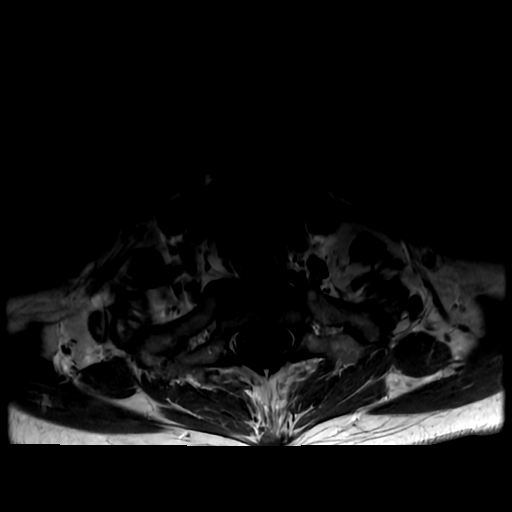

[Series 250: ADC · axial · 3.0mm · 0.94mm/px · z∈[-129,+15]mm · 2 of 49 slices shown (1 of 2)]
[im 1/49]
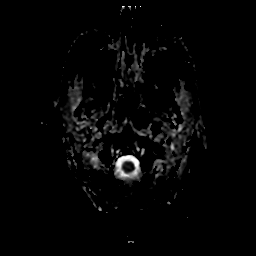
[im 49/49]
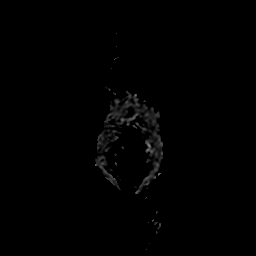

[Series 350: ADC · coronal · 4.0mm · 0.94mm/px · 2 of 36 slices shown (2 of 2)]
[im 1/36]
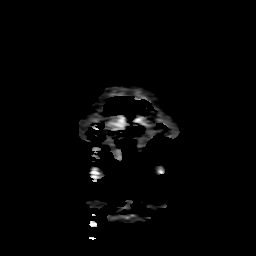
[im 36/36]
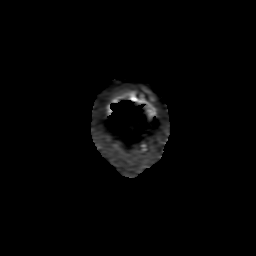

[20 of 48 positions shown; findings below may reference images not displayed]

FINDINGS: MRI HEAD FINDINGS

Brain:

Cerebral volume is normal.

9 mm pineal cyst without suspicious masslike or nodular enhancement.

No cortical encephalomalacia is identified. No significant cerebral
white matter disease.

There is no acute infarct.

No evidence of an intracranial mass.

No chronic intracranial blood products.

No extra-axial fluid collection.

No midline shift.

No pathologic intracranial enhancement identified.

Vascular: Maintained flow voids within the proximal large arterial
vessels.

Skull and upper cervical spine: No focal suspicious marrow lesion.

MRI ORBITS FINDINGS

Orbits: The globes are normal in size and contour. The extraocular
muscles, optic nerve sheath complexes and lacrimal glands are
symmetric and unremarkable. Specifically, no optic nerve signal
abnormality or pathologic optic nerve enhancement is demonstrated to
suggest optic neuritis.

Visualized sinuses: Minimal mucosal thickening within the bilateral
ethmoid sinuses.

Soft tissues: Unremarkable.
IMPRESSION: MRI brain:

1. No evidence of acute intracranial abnormality.
2. 9 mm pineal cyst without suspicious masslike or nodular
enhancement.
3. Otherwise unremarkable MRI appearance of the brain.

MRI orbits:

Unremarkable MRI appearance of the orbits. Specifically, there is no
evidence of optic neuritis.

## 2021-06-25 MED ORDER — GADOBUTROL 1 MMOL/ML IV SOLN
5.0000 mL | Freq: Once | INTRAVENOUS | Status: AC | PRN
  Administered 2021-06-25: 5 mL via INTRAVENOUS

## 2021-06-25 MED ORDER — TETRACAINE HCL 0.5 % OP SOLN
2.0000 [drp] | Freq: Once | OPHTHALMIC | Status: AC
  Administered 2021-06-25: 2 [drp] via OPHTHALMIC
  Filled 2021-06-25: qty 4

## 2021-06-25 MED ORDER — FLUORESCEIN SODIUM 1 MG OP STRP
1.0000 | ORAL_STRIP | Freq: Once | OPHTHALMIC | Status: AC
Start: 2021-06-25 — End: 2021-06-25
  Administered 2021-06-25: 1 via OPHTHALMIC
  Filled 2021-06-25: qty 1

## 2021-06-25 NOTE — Discharge Instructions (Addendum)
Follow up with Neurology and Opthalmology. If you do not have one I have listed one in your discharge instructions ? ?Return for new or worsening symptoms ?

## 2021-06-25 NOTE — ED Provider Notes (Signed)
?[facility] EMERGENCY DEPARTMENT ?Provider Note ? ? ?CSN: [id number] ?Arrival date & time: 06/25/21  0932 ? ?  ? ?History ? ?Chief Complaint  ?Patient presents with  ? Extremity Numbness  ? ? ?Misty Webb is a 41 y.o. female. ? ?The history is provided by the patient and medical records. No language interpreter was used.  ?Eye Pain ?This is a new problem. The current episode started more than 1 week ago. The problem occurs constantly. The problem has been gradually worsening. Associated symptoms include headaches. Pertinent negatives include no chest pain, no abdominal pain and no shortness of breath. Nothing aggravates the symptoms. Nothing relieves the symptoms. She has tried nothing for the symptoms. The treatment provided no relief.  ? ?  ? ?Home Medications ?Prior to Admission medications   ?Medication Sig Start Date End Date Taking? Authorizing Provider  ?Biotin w/ Vitamins C & E (HAIR/SKIN/NAILS PO) Take 1 tablet by mouth daily.    [provider]  ?cetirizine-pseudoephedrine (ZYRTEC-D) 5-120 MG tablet Take 1 tablet by mouth daily.     [provider]  ?ciprofloxacin (CIPRO) 500 MG tablet Take 1 tablet (500 mg total) by mouth 2 (two) times daily. 10/10/18   Tilden Fossa, MD  ?clonazePAM (KLONOPIN) 1 MG tablet Take 0.25-0.5 mg by mouth daily as needed for anxiety.    [provider]  ?ibuprofen (ADVIL,MOTRIN) 800 MG tablet Take 1 tablet (800 mg total) by mouth every 8 (eight) hours as needed for headache. 06/16/15   Charlsie Quest, MD  ?metroNIDAZOLE (FLAGYL) 500 MG tablet Take 1 tablet (500 mg total) by mouth 2 (two) times daily. 10/10/18   Tilden Fossa, MD  ?omeprazole (PRILOSEC) 20 MG capsule Take 20 mg by mouth daily. Reported on 06/16/2015    [provider]  ?predniSONE (DELTASONE) 20 MG tablet Take 3 tablets (60 mg total) by mouth daily. 10/10/18   Tilden Fossa, MD  ?zolpidem (AMBIEN) 10 MG tablet Take 10 mg by mouth at bedtime as needed for sleep.     [provider]  ?   ? ?Allergies    ?Acetaminophen, Latex, and Penicillins   ? ?Review of Systems   ?Review of Systems  ?Constitutional:  Negative for chills, fatigue and fever.  ?HENT:  Negative for congestion.   ?Eyes:  Positive for photophobia, pain and visual disturbance. Negative for redness.  ?Respiratory:  Negative for cough, chest tightness and shortness of breath.   ?Cardiovascular:  Negative for chest pain.  ?Gastrointestinal:  Negative for abdominal pain, constipation, diarrhea, nausea and vomiting.  ?Genitourinary:  Negative for dysuria, flank pain and frequency.  ?Musculoskeletal:  Positive for neck pain (some r neck soreness with the r arm numbness). Negative for back pain and neck stiffness.  ?Skin:  Negative for rash and wound.  ?Neurological:  Positive for numbness and headaches. Negative for dizziness, seizures, speech difficulty, weakness and light-headedness.  ?Psychiatric/Behavioral:  Negative for agitation and confusion.   ?All other systems reviewed and are negative. ? ?Physical Exam ?Updated Vital Signs ?BP 106/82 (BP Location: Left Arm)   Pulse 94   Temp 98.2 ?F (36.8 ?C) (Oral)   Resp 18   Ht 5\' 5"  (1.651 m)   Wt 54.7 kg   LMP 10/02/2018   SpO2 93%   BMI 20.09 kg/m?  ?Physical Exam ?Vitals and nursing note reviewed.  ?Constitutional:   ?   General: She is not in acute distress. ?   Appearance: She is well-developed. She is not ill-appearing, toxic-appearing  or diaphoretic.  ?HENT:  ?   Head: Normocephalic and atraumatic.  ?   Nose: Nose normal.  ?   Mouth/Throat:  ?   Mouth: Mucous membranes are moist.  ?   Pharynx: No oropharyngeal exudate or posterior oropharyngeal erythema.  ?Eyes:  ?   General: Lids are normal. No visual field deficit.    ?   Right eye: No discharge.     ?   Left eye: No discharge.  ?   Intraocular pressure: Right eye pressure is 13 mmHg. Measurements were taken using an automated tonometer. ?   Extraocular Movements: Extraocular movements intact.  ?    Right eye: Normal extraocular motion and no nystagmus.  ?   Left eye: Normal extraocular motion and no nystagmus.  ?   Conjunctiva/sclera: Conjunctivae normal.  ?   Right eye: Right conjunctiva is not injected.  ?   Left eye: Left conjunctiva is not injected.  ?   Pupils: Pupils are equal, round, and reactive to light.  ?Neck:  ?   Vascular: No carotid bruit.  ?Cardiovascular:  ?   Rate and Rhythm: Normal rate and regular rhythm.  ?   Heart sounds: No murmur heard. ?Pulmonary:  ?   Effort: Pulmonary effort is normal. No respiratory distress.  ?   Breath sounds: Normal breath sounds. No wheezing, rhonchi or rales.  ?Chest:  ?   Chest wall: No tenderness.  ?Abdominal:  ?   General: Abdomen is flat.  ?   Palpations: Abdomen is soft.  ?   Tenderness: There is no abdominal tenderness. There is no right CVA tenderness, left CVA tenderness, guarding or rebound.  ?Musculoskeletal:     ?   General: No swelling or tenderness.  ?   Cervical back: Neck supple. No tenderness.  ?   Right lower leg: No edema.  ?   Left lower leg: No edema.  ?Skin: ?   General: Skin is warm and dry.  ?   Capillary Refill: Capillary refill takes less than 2 seconds.  ?   Findings: No erythema or rash.  ?Neurological:  ?   Mental Status: She is alert.  ?   Cranial Nerves: No dysarthria or facial asymmetry.  ?   Sensory: Sensory deficit present.  ?   Motor: No weakness, tremor, abnormal muscle tone or seizure activity.  ?   Coordination: Finger-Nose-Finger Test normal.  ?   Comments: Numbness in right arm compared to left.  Normal strength otherwise.   ?Psychiatric:     ?   Mood and Affect: Mood normal.  ? ? ?ED Results / Procedures / Treatments   ?Labs ?(all labs ordered are listed, but only abnormal results are displayed) ?Labs Reviewed  ?CBC WITH DIFFERENTIAL/PLATELET  ?COMPREHENSIVE METABOLIC PANEL  ? ? ?EKG ?None ? ?Radiology ?No results found. ? ?Procedures ?Procedures  ? ? ?Medications Ordered in ED ?Medications  ?tetracaine (PONTOCAINE) 0.5 %  ophthalmic solution 2 drop (2 drops Right Eye Given by Other 06/25/21 1033)  ?fluorescein ophthalmic strip 1 strip (1 strip Right Eye Given by Other 06/25/21 1033)  ? ? ?ED Course/ Medical Decision Making/ A&P ?  ?                        ?Medical Decision Making ?Amount and/or Complexity of Data Reviewed ?Labs: ordered. ?Radiology: ordered. ? ?Risk ?Prescription drug management. ? ? ? ?OVAL MORALEZ is a 41 y.o. female with past medical history significant for  pelvic congestion syndrome status post hysterectomy, previous endometriosis, GERD, and anxiety who presents with 1 week of right eye pain with blurry vision and 1 day of right arm numbness.  According to patient, several years ago patient did have some left arm numbness that they were unable to determine etiology of during her previous work-up.  She has not had any symptoms recently with that.  She reports that for the last week she has had pain in and around her right eye but denies any trauma to it.  She reports it is slightly photosensitive but it does not feel like previous headaches or migraines.  She reports the pain has been persistent with the right eye however this morning while getting her kids to school, she noticed numbness in her right arm that seems to come from the neck.  It is slightly painful but it is not weakness.  She denies any leg symptoms.  She otherwise denies fevers, chills, chest pain, shortness of breath, palpitations, nausea, vomiting, constipation, diarrhea, or urinary changes.  No rashes. ? ?On my exam, patient has intact sensation in face.  No facial droop.  Symmetric smile.  Pupils were symmetric and reactive normal extraocular movements.  No nystagmus.  Patient did have some photophobia on the right side.  Neck was nontender and I cannot reproduce her numbness changes.  Patient did report numbness in the right arm compared to left.  Intact grip strength.  Intact pulses.  Legs were nontender and nonedematous and had intact  sensation and strength and pulses.  Exam otherwise unremarkable.  No carotid bruit appreciated.  I was able to look in the eyes with ophthalmoscope but did not see any clear abnormalities initially. ? ?Clinically gi

## 2021-06-25 NOTE — ED Provider Notes (Signed)
This is a 41 year old female recently transferred from [facility] to the ER for imaging of her brain due to patient having complaints of arm numbness and vision changes.  I have reviewed previous note from Dr. Rush Landmark who has initially seen evaluate patient.  Patient with finding concerning for MS, neurology Dr. Otelia Limes was consulted and recommend patient to have MRI of brain with and without contrast as well as MRI of her cervical spine with and without contrast.  Imaging has been ordered, and will await for it to be performed. ? ?3:05 PM ?Patient signed out to oncoming provider who will follow up on MRI result and determine disposition. ? ?BP 97/73 (BP Location: Right Arm)   Pulse 91   Temp 98 ?F (36.7 ?C) (Oral)   Resp 18   Ht 5\' 5"  (1.651 m)   Wt 54.7 kg   LMP 10/02/2018   SpO2 99%   BMI 20.09 kg/m?  ? ?Results for orders placed or performed during the hospital encounter of 06/25/21  ?CBC with Differential  ?Result Value Ref Range  ? WBC 5.1 4.0 - 10.5 K/uL  ? RBC 4.72 3.87 - 5.11 MIL/uL  ? Hemoglobin 14.9 12.0 - 15.0 g/dL  ? HCT 44.1 36.0 - 46.0 %  ? MCV 93.4 80.0 - 100.0 fL  ? MCH 31.6 26.0 - 34.0 pg  ? MCHC 33.8 30.0 - 36.0 g/dL  ? RDW 11.6 11.5 - 15.5 %  ? Platelets 273 150 - 400 K/uL  ? nRBC 0.0 0.0 - 0.2 %  ? Neutrophils Relative % 63 %  ? Neutro Abs 3.2 1.7 - 7.7 K/uL  ? Lymphocytes Relative 27 %  ? Lymphs Abs 1.4 0.7 - 4.0 K/uL  ? Monocytes Relative 8 %  ? Monocytes Absolute 0.4 0.1 - 1.0 K/uL  ? Eosinophils Relative 1 %  ? Eosinophils Absolute 0.1 0.0 - 0.5 K/uL  ? Basophils Relative 1 %  ? Basophils Absolute 0.0 0.0 - 0.1 K/uL  ? Immature Granulocytes 0 %  ? Abs Immature Granulocytes 0.01 0.00 - 0.07 K/uL  ?Comprehensive metabolic panel  ?Result Value Ref Range  ? Sodium 140 135 - 145 mmol/L  ? Potassium 4.2 3.5 - 5.1 mmol/L  ? Chloride 104 98 - 111 mmol/L  ? CO2 28 22 - 32 mmol/L  ? Glucose, Bld 86 70 - 99 mg/dL  ? BUN 9 6 - 20 mg/dL  ? Creatinine, Ser 0.60 0.44 - 1.00 mg/dL  ?  Calcium 10.1 8.9 - 10.3 mg/dL  ? Total Protein 7.2 6.5 - 8.1 g/dL  ? Albumin 4.7 3.5 - 5.0 g/dL  ? AST 16 15 - 41 U/L  ? ALT 16 0 - 44 U/L  ? Alkaline Phosphatase 69 38 - 126 U/L  ? Total Bilirubin 0.6 0.3 - 1.2 mg/dL  ? GFR, Estimated >60 >60 mL/min  ? Anion gap 8 5 - 15  ? ?No results found. ? ?  ?06/27/21, PA-C ?06/25/21 1505 ? ?  ?06/27/21, MD ?06/25/21 1639 ? ?

## 2021-06-25 NOTE — ED Provider Notes (Signed)
Seen from previous provider at shift change.  See note for full HPI, plan ? ?In summation patient transferred from [facility] for MRI with concern for MS.  Patient with eye pressure, intermittent tingling in her right upper extremity. ? ?Per previous provider note discussed with Dr. Otelia Limes with neurology who recommended MRI orbits, brain, C-spine. ? ?Eye exam appears provider without significant abnormality. ? ?If negative MRIs, patient can follow-up with neuro/ophthalmology. ?Physical Exam  ?BP 97/73 (BP Location: Right Arm)   Pulse 91   Temp 98 ?F (36.7 ?C) (Oral)   Resp 18   Ht 5\' 5"  (1.651 m)   Wt 54.7 kg   LMP 10/02/2018   SpO2 99%   BMI 20.09 kg/m?  ? ?Physical Exam ?Vitals and nursing note reviewed.  ?Constitutional:   ?   General: She is not in acute distress. ?   Appearance: She is well-developed. She is not ill-appearing, toxic-appearing or diaphoretic.  ?HENT:  ?   Head: Atraumatic.  ?Eyes:  ?   Pupils: Pupils are equal, round, and reactive to light.  ?Cardiovascular:  ?   Rate and Rhythm: Normal rate.  ?Pulmonary:  ?   Effort: No respiratory distress.  ?Abdominal:  ?   General: There is no distension.  ?Musculoskeletal:     ?   General: Normal range of motion.  ?   Cervical back: Normal range of motion and neck supple. No tenderness.  ?Skin: ?   General: Skin is warm and dry.  ?Neurological:  ?   General: No focal deficit present.  ?   Mental Status: She is alert and oriented to person, place, and time.  ?   Cranial Nerves: No cranial nerve deficit.  ?   Sensory: No sensory deficit.  ?   Motor: No weakness.  ?   Coordination: Coordination normal.  ?Psychiatric:     ?   Mood and Affect: Mood normal.  ? ?. ?Procedures  ?Procedures ?Labs Reviewed  ?CBC WITH DIFFERENTIAL/PLATELET  ?COMPREHENSIVE METABOLIC PANEL  ? MR Brain W and Wo Contrast ? ?Result Date: 06/25/2021 ?CLINICAL DATA:  Vision loss, monocular; right eye vision blurriness, right eye pain, right arm numbness. Possible optic  neuritis. EXAM: MRI HEAD AND ORBITS WITHOUT AND WITH CONTRAST TECHNIQUE: Multiplanar, multiecho pulse sequences of the brain and surrounding structures were obtained without and with intravenous contrast. Multiplanar, multiecho pulse sequences of the orbits and surrounding structures were obtained including fat saturation techniques, before and after intravenous contrast administration. CONTRAST:  75mL GADAVIST GADOBUTROL 1 MMOL/ML IV SOLN COMPARISON:  CT angiogram head/neck 07/19/2019.  Head CT 07/18/2019. FINDINGS: MRI HEAD FINDINGS Brain: Cerebral volume is normal. 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 No cortical encephalomalacia is identified. No significant cerebral white matter disease. There is no acute infarct. No evidence of an intracranial mass. No chronic intracranial blood products. No extra-axial fluid collection. No midline shift. No pathologic intracranial enhancement identified. Vascular: Maintained flow voids within the proximal large arterial vessels. Skull and upper cervical spine: No focal suspicious marrow lesion. MRI ORBITS FINDINGS Orbits: The globes are normal in size and contour. The extraocular muscles, optic nerve sheath complexes and lacrimal glands are symmetric and unremarkable. Specifically, no optic nerve signal abnormality or pathologic optic nerve enhancement is demonstrated to suggest optic neuritis. Visualized sinuses: Minimal mucosal thickening within the bilateral ethmoid sinuses. Soft tissues: Unremarkable. IMPRESSION: MRI brain: 1. No evidence of acute intracranial abnormality. 2. 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 3. Otherwise unremarkable MRI appearance of the brain. MRI orbits: Unremarkable MRI appearance of the orbits. Specifically, there is no evidence of optic neuritis. Electronically Signed   By: Jackey LogeKyle  Golden D.O.   On: 06/25/2021 15:43  ? ?MR Cervical Spine W or Wo Contrast ? ?Result Date:  06/25/2021 ?CLINICAL DATA:  Provided history: Right eye vision blurriness, right eye pain, right arm numbness. EXAM: MRI CERVICAL SPINE WITHOUT AND WITH CONTRAST TECHNIQUE: Multiplanar and multiecho pulse sequences of the cervical spine, to include the craniocervical junction and cervicothoracic junction, were obtained without and with intravenous contrast. CONTRAST:  5mL GADAVIST GADOBUTROL 1 MMOL/ML IV SOLN COMPARISON:  Concurrently performed MRI head and MRI orbits 06/25/2021. FINDINGS: Alignment: Straightening of the expected cervical lordosis. No significant spondylolisthesis. Vertebrae: Vertebral body height is maintained. No significant marrow edema or focal suspicious osseous lesion. Cord: No signal abnormality identified within the cervical spinal cord. No pathologic spinal cord enhancement. Posterior Fossa, vertebral arteries, paraspinal tissues: Intracranial contents better assessed on concurrently performed brain MRI. Flow voids preserved within the imaged cervical vertebral arteries. Paraspinal soft tissues unremarkable. 9 mm T2 hypointense nodule within the right thyroid lobe, not meeting consensus criteria for ultrasound follow-up based on size. Disc levels: Mild multilevel disc degeneration, greatest at C5-C6. C2-C3: No significant disc herniation or stenosis. C3-C4: No significant disc herniation or stenosis. C4-C5: No significant disc herniation or stenosis. C5-C6: Small left center disc protrusion at site of posterior annular fissure. The disc protrusion results in mild focal effacement of the ventral thecal sac (without spinal cord mass effect). No significant foraminal stenosis. C6-C7: No significant disc herniation or stenosis. C7-T1: No significant disc herniation or stenosis. IMPRESSION: No signal abnormality identified within the cervical spinal cord. No pathologic spinal cord enhancement. Mild cervical spondylosis, as outlined. Most notably at C5-C6, there is a small left center disc  protrusion at site of posterior annular fissure. The disc protrusion results in mild focal effacement of the ventral thecal sac (without spinal cord mass effect). Nonspecific straightening of the expected cervical lordosis. Electronically Signed   By: Jackey LogeKyle  Golden D.O.   On: 06/25/2021 15:51  ? ?MR ORBITS W WO CONTRAST ? ?Result Date: 06/25/2021 ?CLINICAL DATA:  Vision loss, monocular; right eye vision blurriness, right eye pain, right arm numbness. Possible optic neuritis. EXAM: MRI HEAD AND ORBITS WITHOUT AND WITH CONTRAST TECHNIQUE: Multiplanar, multiecho pulse sequences of the brain and surrounding structures were obtained without and with intravenous contrast. Multiplanar, multiecho pulse sequences of the orbits and surrounding structures were obtained including fat saturation techniques, before and after intravenous contrast administration. CONTRAST:  5mL GADAVIST GADOBUTROL 1 MMOL/ML IV SOLN COMPARISON:  CT angiogram head/neck 07/19/2019.  Head CT 07/18/2019. FINDINGS: MRI HEAD FINDINGS Brain: Cerebral volume is normal. 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 No cortical encephalomalacia is identified. No significant cerebral white matter disease. There is no acute infarct. No evidence of an intracranial mass. No chronic intracranial blood products. No extra-axial fluid collection. No midline shift. No pathologic intracranial enhancement identified. Vascular: Maintained flow voids within the proximal large arterial vessels. Skull and upper cervical spine: No focal suspicious marrow lesion. MRI ORBITS FINDINGS Orbits: The globes are normal in size and contour. The extraocular muscles, optic nerve sheath complexes and lacrimal glands are symmetric and unremarkable. Specifically, no optic nerve signal abnormality or pathologic optic nerve enhancement is demonstrated to suggest optic neuritis. Visualized sinuses: Minimal mucosal thickening within the bilateral ethmoid sinuses. Soft  tissues: Unremarkable. IMPRESSION:  MRI brain: 1. No evidence of acute intracranial abnormality. 2. 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 3. Otherwise unremarkable MRI appearance of the brain. MRI orbits: U

## 2021-06-25 NOTE — ED Notes (Signed)
Patient given transfer paperwork in a secure folder. Explained to patient to not open the folder because it is for hospital staff only at [facility]. Instructed patient to give the folder to the front desk. Explained to patient that she would keep her IV in her arm due to needing it for MRI. Pt father confirmed that he was driving patient to [facility] ER.  ?

## 2021-06-25 NOTE — ED Notes (Signed)
ED Provider at bedside. 

## 2021-06-25 NOTE — ED Notes (Signed)
Discharge instructions reviewed with patient. Patient verbalized understanding of instructions. Follow-up care and medications were reviewed. Patient ambulatory with steady gait. VSS upon discharge.  ?

## 2021-06-25 NOTE — ED Notes (Signed)
Pt ambulatory with steady gait to restroom, will provide urine specimen 

## 2021-06-25 NOTE — ED Notes (Signed)
Tonopen and woods lamp at bedside 

## 2021-06-25 NOTE — ED Triage Notes (Signed)
C/o right eye pressure x 1 week. States woke up at 0530 with right arm numbness that has been intermittent, but worsening. Went to sleep at 2230 last night. No other deficits. Took klonopin pta. ?

## 2022-02-24 ENCOUNTER — Emergency Department (HOSPITAL_BASED_OUTPATIENT_CLINIC_OR_DEPARTMENT_OTHER)
Admission: EM | Admit: 2022-02-24 | Discharge: 2022-02-24 | Disposition: A | Discharge status: Home / Self Care | Payer: 59 | Attending: Emergency Medicine | Admitting: Emergency Medicine

## 2022-02-24 ENCOUNTER — Other Ambulatory Visit: Payer: Self-pay

## 2022-02-24 ENCOUNTER — Emergency Department (HOSPITAL_BASED_OUTPATIENT_CLINIC_OR_DEPARTMENT_OTHER): Payer: 59

## 2022-02-24 DIAGNOSIS — W1830XA Fall on same level, unspecified, initial encounter: Secondary | ICD-10-CM | POA: Insufficient documentation

## 2022-02-24 DIAGNOSIS — S62346A Nondisplaced fracture of base of fifth metacarpal bone, right hand, initial encounter for closed fracture: Secondary | ICD-10-CM | POA: Insufficient documentation

## 2022-02-24 DIAGNOSIS — Z91040 Latex allergy status: Secondary | ICD-10-CM | POA: Diagnosis not present

## 2022-02-24 DIAGNOSIS — S6991XA Unspecified injury of right wrist, hand and finger(s), initial encounter: Secondary | ICD-10-CM | POA: Diagnosis present

## 2022-02-24 NOTE — ED Provider Notes (Signed)
[facility] Provider Note   CSN: [id number] Arrival date & time: 02/24/22  [id number]     History  Chief Complaint  Patient presents with   Hand Injury    Misty Webb is a 41 y.o. female who presented today with a R hand injury after a fall at 0800 this morning. Patient does not remember how she landed on her hand but endorsed constant 5-6/10 non-radiating dull pain to the base of the fifth metacarpal along with skin discoloration on the dorsal aspect of her hand and decreased hand ROM. Patient reported ice alleviates the pain and palpation will exacerbate the pain. Patient denied loss of sensation. Patient is most concerned for a fracture.     Home Medications Prior to Admission medications   Medication Sig Start Date End Date Taking? Authorizing Provider  Biotin w/ Vitamins C & E (HAIR/SKIN/NAILS PO) Take 1 tablet by mouth daily.    [provider]  cetirizine-pseudoephedrine (ZYRTEC-D) 5-120 MG tablet Take 1 tablet by mouth daily.     [provider]  ciprofloxacin (CIPRO) 500 MG tablet Take 1 tablet (500 mg total) by mouth 2 (two) times daily. 10/10/18   Quintella Reichert, MD  clonazePAM (KLONOPIN) 1 MG tablet Take 0.25-0.5 mg by mouth daily as needed for anxiety.    [provider]  ibuprofen (ADVIL,MOTRIN) 800 MG tablet Take 1 tablet (800 mg total) by mouth every 8 (eight) hours as needed for headache. 06/16/15   Lenore Cordia, MD  metroNIDAZOLE (FLAGYL) 500 MG tablet Take 1 tablet (500 mg total) by mouth 2 (two) times daily. 10/10/18   Quintella Reichert, MD  omeprazole (PRILOSEC) 20 MG capsule Take 20 mg by mouth daily. Reported on 06/16/2015    [provider]  predniSONE (DELTASONE) 20 MG tablet Take 3 tablets (60 mg total) by mouth daily. 10/10/18   Quintella Reichert, MD  zolpidem (AMBIEN) 10 MG tablet Take 10 mg by mouth at bedtime as needed for sleep.    [provider]      Allergies    Acetaminophen, Latex,  and Penicillins    Review of Systems   Review of Systems  Musculoskeletal:        + R hand pain + swollen R hand  Skin:  Positive for color change (+ eechymosis). Negative for pallor.  Neurological:  Positive for weakness. Negative for numbness.    Physical Exam Updated Vital Signs BP (!) 110/93 (BP Location: Left Arm)   Pulse 89   Temp 98.4 F (36.9 C) (Oral)   Resp 18   Ht 5\' 3"  (1.6 m)   Wt 54.4 kg   LMP 10/02/2018   SpO2 100%   BMI 21.26 kg/m  Physical Exam Vitals reviewed.  Constitutional:      General: She is not in acute distress.    Appearance: Normal appearance. She is well-developed and normal weight.  HENT:     Head: Normocephalic and atraumatic.  Eyes:     Conjunctiva/sclera: Conjunctivae normal.  Pulmonary:     Effort: Pulmonary effort is normal.  Abdominal:     Tenderness: There is no abdominal tenderness.  Musculoskeletal:        General: Tenderness present. No swelling.     Right hand: Swelling and bony tenderness present. Decreased range of motion. Decreased strength of thumb/finger opposition. Normal sensation. Normal capillary refill. Normal pulse.     Left hand: Normal.     Cervical back: Normal range of motion.  Skin:  General: Skin is warm and dry.     Capillary Refill: Capillary refill takes less than 2 seconds.     Findings: Bruising present.  Neurological:     Mental Status: She is alert.  Psychiatric:        Mood and Affect: Mood normal.     ED Results / Procedures / Treatments   Labs (all labs ordered are listed, but only abnormal results are displayed) Labs Reviewed - No data to display  EKG None  Radiology DG Wrist Complete Right  Result Date: 02/24/2022 CLINICAL DATA:  Fall with hand and wrist pain. EXAM: RIGHT WRIST - COMPLETE 3+ VIEW COMPARISON:  Hand evaluation of the same date. FINDINGS: Irregularity of the base of the fifth metacarpal. No carpal bone fracture. No sign of dislocation. Mild soft tissue swelling may be  present in the area. IMPRESSION: 1. Irregularity of the base of the fifth metacarpal. Correlate with point tenderness in this area to exclude nondisplaced fracture. Difficult to exclude intra-articular component if this is correlated with point tenderness and potential fracture. 2. No carpal bone fracture or dislocation. Electronically Signed   By: Zetta Bills M.D.   On: 02/24/2022 09:55   DG Hand Complete Right  Result Date: 02/24/2022 CLINICAL DATA:  Fall with pain to the hand and wrist. EXAM: RIGHT HAND - COMPLETE 3+ VIEW COMPARISON:  Wrist evaluation of the same date. FINDINGS: Mild degenerative changes of the distal interphalangeal joints. Osteopenia. Query mild soft tissue swelling about the hand. No radiopaque foreign body. Irregularity of the base of the fifth metacarpal along the lateral aspect best seen on oblique view. IMPRESSION: 1. Irregularity of the base of the fifth metacarpal along the lateral aspect best seen on oblique view. Correlate with point tenderness in this area to exclude nondisplaced fracture. Based on oblique projection intra-articular extension is considered 2. Mild degenerative changes of the distal interphalangeal joints. Electronically Signed   By: Zetta Bills M.D.   On: 02/24/2022 09:52    Procedures Procedures    Medications Ordered in ED Medications - No data to display  ED Course/ Medical Decision Making/ A&P Clinical Course as of 02/24/22 1156  Wed Feb 24, 2022  1113 This is a 41 year old right-handed female presenting to the ED with a mechanical fall today onto an outstretched right hand.  No other injuries reported.  On exam she is neurovascularly intact.  She is able to flex and extend her fingers.  She does have swelling and tenderness at the base of the right fifth metacarpal.  X-ray shows a nondisplaced fracture at the base of the right fifth metacarpal.  She was placed in an ulnar gutter splint and will follow-up with a hand surgeon in the office.   She verbalized understanding [MT]    Clinical Course User Index [MT] Trifan, Carola Rhine, MD                           Medical Decision Making Misty Webb is a 41 y.o. female who presented today with a R hand injury after a fall at 0800 this morning. Some emergent, surgical, life-threatening diagnoses I considered were an open displaced metacarpal fracture, arterial occlusion.  Review of prior external notes: none  Unique Tests & Interpretation:  - R hand XR: non-displaced fracture at base of fifth metacarpal  Discussion with Independent Historian: none  Discussion of management of test(s): discussed with the patient the XR found a non-displaced fracture at  the base of her R metacarpal. Patient verbalized understanding.  Risk: Low based on testing and treatment  Risk Stratification Scores: none  Staffed with Dr. Renaye Rakers  XR showed a non-displaced fracture at the base of the fifth metacarpal. Patient will be given an ulnar gutter splint to immobilize R hand until she is able to ortho hand. Patient was instructed to keep splint dry and on at all times until she is able to see ortho hand, an appt she is to make. Patient was educated take ibuprofen as needed and to return to ED if symptoms worsen. Patient verbalized understanding.    Amount and/or Complexity of Data Reviewed Radiology: ordered.    Details: Reviewed R Hand XR: base of fifth metacarpal has a non-displaced fracture           Final Clinical Impression(s) / ED Diagnoses Final diagnoses:  Nondisplaced fracture of base of fifth metacarpal bone, right hand, initial encounter for closed fracture    Rx / DC Orders ED Discharge Orders     None         Remi Deter 02/24/22 1217    Terald Sleeper, MD 02/24/22 1325

## 2022-02-24 NOTE — ED Triage Notes (Signed)
States fell this morning and landed on right hand. Pain to hand and wrist.

## 2022-09-22 ENCOUNTER — Emergency Department (HOSPITAL_BASED_OUTPATIENT_CLINIC_OR_DEPARTMENT_OTHER)
Admission: EM | Admit: 2022-09-22 | Discharge: 2022-09-22 | Discharge status: Against Medical Advice | Payer: Managed Care, Other (non HMO) | Attending: Emergency Medicine | Admitting: Emergency Medicine

## 2022-09-22 ENCOUNTER — Encounter (HOSPITAL_BASED_OUTPATIENT_CLINIC_OR_DEPARTMENT_OTHER): Payer: Self-pay | Admitting: Emergency Medicine

## 2022-09-22 ENCOUNTER — Other Ambulatory Visit: Payer: Self-pay

## 2022-09-22 DIAGNOSIS — R110 Nausea: Secondary | ICD-10-CM | POA: Diagnosis present

## 2022-09-22 DIAGNOSIS — Z5321 Procedure and treatment not carried out due to patient leaving prior to being seen by health care provider: Secondary | ICD-10-CM | POA: Diagnosis not present
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
## 2022-11-29 ENCOUNTER — Emergency Department (HOSPITAL_BASED_OUTPATIENT_CLINIC_OR_DEPARTMENT_OTHER)
Admission: EM | Admit: 2022-11-29 | Discharge: 2022-11-30 | Disposition: A | Discharge status: Home / Self Care | Payer: Managed Care, Other (non HMO) | Attending: Emergency Medicine | Admitting: Emergency Medicine

## 2022-11-29 ENCOUNTER — Other Ambulatory Visit: Payer: Self-pay

## 2022-11-29 ENCOUNTER — Encounter (HOSPITAL_BASED_OUTPATIENT_CLINIC_OR_DEPARTMENT_OTHER): Payer: Self-pay

## 2022-11-29 ENCOUNTER — Emergency Department (HOSPITAL_BASED_OUTPATIENT_CLINIC_OR_DEPARTMENT_OTHER): Payer: Managed Care, Other (non HMO)

## 2022-11-29 DIAGNOSIS — S0101XA Laceration without foreign body of scalp, initial encounter: Secondary | ICD-10-CM | POA: Insufficient documentation

## 2022-11-29 DIAGNOSIS — W228XXA Striking against or struck by other objects, initial encounter: Secondary | ICD-10-CM | POA: Insufficient documentation

## 2022-11-29 DIAGNOSIS — F419 Anxiety disorder, unspecified: Secondary | ICD-10-CM | POA: Diagnosis not present

## 2022-11-29 DIAGNOSIS — S0990XA Unspecified injury of head, initial encounter: Secondary | ICD-10-CM | POA: Diagnosis present

## 2022-11-29 DIAGNOSIS — Z91040 Latex allergy status: Secondary | ICD-10-CM | POA: Diagnosis not present

## 2022-11-29 MED ORDER — LIDOCAINE-EPINEPHRINE (PF) 2 %-1:200000 IJ SOLN
10.0000 mL | Freq: Once | INTRAMUSCULAR | Status: AC
  Administered 2022-11-29: 10 mL
  Filled 2022-11-29: qty 20

## 2022-11-29 MED ORDER — HYDROCHLOROTHIAZIDE 25 MG PO TABS: 25.0000 mg | ORAL_TABLET | Freq: Once | ORAL | Status: DC

## 2022-11-29 MED ORDER — AMLODIPINE BESYLATE 5 MG PO TABS: 10.0000 mg | ORAL_TABLET | Freq: Once | ORAL | Status: DC

## 2022-11-29 MED ORDER — IBUPROFEN 400 MG PO TABS
600.0000 mg | ORAL_TABLET | Freq: Once | ORAL | Status: AC
  Administered 2022-11-29: 600 mg via ORAL
  Filled 2022-11-29: qty 1

## 2022-11-29 NOTE — Discharge Instructions (Signed)
Please read and follow all provided instructions.  Your diagnoses today include:  1. Laceration of scalp, initial encounter   2. Minor head injury, initial encounter     Tests performed today include: CT scan of the head was negative for intracranial injury Vital signs. See below for your results today.   Medications prescribed:  Please use over-the-counter NSAID medications (ibuprofen, naproxen) or Tylenol (acetaminophen) as directed on the packaging for pain -- as long as you do not have any reasons avoid these medications. Reasons to avoid NSAID medications include: weak kidneys, a history of bleeding in your stomach or gut, or uncontrolled high blood pressure or previous heart attack. Reasons to avoid Tylenol include: liver problems or ongoing alcohol use. Never take more than 4000mg  or 8 Extra strength Tylenol in a 24 hour period.     Take any prescribed medications only as directed.   Home care instructions:  Follow any educational materials and wound care instructions contained in this packet.   Keep affected area above the level of your heart when possible to minimize swelling. Wash area gently twice a day with warm soapy water. Do not apply alcohol or hydrogen peroxide. Cover the area if it draining or weeping.   Follow-up instructions: Suture Removal: Return to the Emergency Department or see your primary care care doctor in 5-7 days for a recheck of your wound and removal of your sutures or staples.    Return instructions:  Return to the Emergency Department if you have: Fever Worsening pain Worsening swelling of the wound Pus draining from the wound Redness of the skin that moves away from the wound, especially if it streaks away from the affected area  Any other emergent concerns  Your vital signs today were: BP 111/82 (BP Location: Left Arm)   Pulse 88   Temp (!) 97.1 F (36.2 C) (Oral)   Resp 15   Ht 5\' 3"  (1.6 m)   Wt 54.4 kg   LMP 10/02/2018   SpO2 100%    BMI 21.26 kg/m  If your blood pressure (BP) was elevated above 135/85 this visit, please have this repeated by your doctor within one month. --------------

## 2022-11-29 NOTE — ED Triage Notes (Signed)
Patient presents with laceration to the right side of her head. Patient hit her head on a piece of metal artwork. No LOC. No blood thinners. Tetanus in 2017.
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
## 2022-11-29 NOTE — ED Provider Notes (Incomplete)
[place] [facility] Provider Note   CSN: [id number] Arrival date & time: 11/29/22  2107     History {Add pertinent medical, surgical, social history, OB history to HPI:1} Chief Complaint  Patient presents with  . Laceration    Misty Webb is a 42 y.o. female.  Patient presents to the emergency department today for evaluation of head injury.  This occurred a couple of hours prior to arrival.  Patient stood up next to a dresser that had a piece of unsecured metal art on it.  She struck the right side of her forehead and had immediate pain and bleeding from a laceration that was sustained.  She states that she fell to the floor but did not lose consciousness.  She felt very dizzy and had difficulty standing up.  She vomited 1 time.  She was crying because the pain.  She needed to be assisted by family downstairs when she was able to stand up.  Symptoms have gradually improved but have not resolved.  Currently complaining of a headache.  No vision change.  No weakness, numbness, ting or tingling in the arms of the legs.  Her last tetanus was in 2017.       Home Medications Prior to Admission medications   Medication Sig Start Date End Date Taking? Authorizing Provider  Biotin w/ Vitamins C & E (HAIR/SKIN/NAILS PO) Take 1 tablet by mouth daily.    [provider]  cetirizine-pseudoephedrine (ZYRTEC-D) 5-120 MG tablet Take 1 tablet by mouth daily.     [provider]  ciprofloxacin (CIPRO) 500 MG tablet Take 1 tablet (500 mg total) by mouth 2 (two) times daily. 10/10/18   Tilden Fossa, MD  clonazePAM (KLONOPIN) 1 MG tablet Take 0.25-0.5 mg by mouth daily as needed for anxiety.    [provider]  ibuprofen (ADVIL,MOTRIN) 800 MG tablet Take 1 tablet (800 mg total) by mouth every 8 (eight) hours as needed for headache. 06/16/15   Charlsie Quest, MD  metroNIDAZOLE (FLAGYL) 500 MG tablet Take 1 tablet (500 mg total) by mouth 2  (two) times daily. 10/10/18   Tilden Fossa, MD  omeprazole (PRILOSEC) 20 MG capsule Take 20 mg by mouth daily. Reported on 06/16/2015    [provider]  predniSONE (DELTASONE) 20 MG tablet Take 3 tablets (60 mg total) by mouth daily. 10/10/18   Tilden Fossa, MD  zolpidem (AMBIEN) 10 MG tablet Take 10 mg by mouth at bedtime as needed for sleep.    [provider]      Allergies    Acetaminophen, Latex, and Penicillins    Review of Systems   Review of Systems  Physical Exam Updated Vital Signs BP 111/82 (BP Location: Left Arm)   Pulse 88   Temp (!) 97.1 F (36.2 C) (Oral)   Resp 15   Ht 5\' 3"  (1.6 m)   Wt 54.4 kg   LMP 10/02/2018   SpO2 100%   BMI 21.26 kg/m  Physical Exam Vitals and nursing note reviewed.  Constitutional:      Appearance: She is well-developed.  HENT:     Head: Normocephalic. No raccoon eyes or Battle's sign.     Comments: There is a 2 cm, linear, minimally gaping laceration to the right temporal scalp without any bleeding.    Right Ear: Tympanic membrane, ear canal and external ear normal. No hemotympanum.     Left Ear: Tympanic membrane, ear canal and external ear normal. No  hemotympanum.     Nose: Nose normal.     Mouth/Throat:     Mouth: Mucous membranes are moist.     Pharynx: Uvula midline.  Eyes:     General: Lids are normal.     Extraocular Movements:     Right eye: No nystagmus.     Left eye: No nystagmus.     Conjunctiva/sclera: Conjunctivae normal.     Pupils: Pupils are equal, round, and reactive to light.     Comments: No visible hyphema noted  Cardiovascular:     Rate and Rhythm: Normal rate and regular rhythm.  Pulmonary:     Effort: Pulmonary effort is normal.     Breath sounds: Normal breath sounds.  Abdominal:     Palpations: Abdomen is soft.     Tenderness: There is no abdominal tenderness.  Musculoskeletal:     Cervical back: Normal range of motion and neck supple. No tenderness or bony tenderness.      Thoracic back: No tenderness or bony tenderness.     Lumbar back: No tenderness or bony tenderness.  Skin:    General: Skin is warm and dry.  Neurological:     Mental Status: She is alert and oriented to person, place, and time.     GCS: GCS eye subscore is 4. GCS verbal subscore is 5. GCS motor subscore is 6.     Cranial Nerves: No cranial nerve deficit.     Sensory: No sensory deficit.     Coordination: Coordination normal.     ED Results / Procedures / Treatments   Labs (all labs ordered are listed, but only abnormal results are displayed) Labs Reviewed - No data to display  EKG None  Radiology No results found.  Procedures .Marland KitchenLaceration Repair  Date/Time: 11/29/2022 11:13 PM  Performed by: Renne Crigler, PA-C Authorized by: Renne Crigler, PA-C   Consent:    Consent obtained:  Verbal   Consent given by:  Patient   Risks discussed:  Infection and pain   Alternatives discussed:  No treatment Universal protocol:    Patient identity confirmed:  Verbally with patient and provided demographic data Anesthesia:    Anesthesia method:  Local infiltration   Local anesthetic:  Lidocaine 2% WITH epi Laceration details:    Location:  Scalp   Scalp location:  R temporal   Length (cm):  2 Pre-procedure details:    Preparation:  Patient was prepped and draped in usual sterile fashion and imaging obtained to evaluate for foreign bodies Exploration:    Imaging outcome: foreign body not noted     Wound exploration: entire depth of wound visualized   Treatment:    Area cleansed with:  Shur-Clens   Amount of cleaning:  Standard Skin repair:    Repair method:  Sutures   Suture size:  5-0   Suture material:  Prolene   Suture technique:  Simple interrupted   Number of sutures:  3 Approximation:    Approximation:  Close Repair type:    Repair type:  Simple Post-procedure details:    Dressing:  Open (no dressing)   Procedure completion:  Tolerated well, no immediate  complications   {Document cardiac monitor, telemetry assessment procedure when appropriate:1}  Medications Ordered in ED Medications  ibuprofen (ADVIL) tablet 600 mg (has no administration in time range)  lidocaine-EPINEPHrine (XYLOCAINE W/EPI) 2 %-1:200000 (PF) injection 10 mL (has no administration in time range)    ED Course/ Medical Decision Making/ A&P    Patient seen  and examined. History obtained directly from patient and family member at bedside.  Labs/EKG: None ordered  Imaging: Ordered CT head -- discussed risks and benefits of obtaining imaging.  We discussed possible observation since she is feeling better, however given her pain, dizziness, and vomiting, I do not think it would be wrong to obtain imaging.  Patient is very anxious and is worried about the injury and would prefer to have imaging performed.  Head CT ordered.  Medications/Fluids: Ordered: Lidocaine 2% with epinephrine  Most recent vital signs reviewed and are as follows: BP 111/82 (BP Location: Left Arm)   Pulse 88   Temp (!) 97.1 F (36.2 C) (Oral)   Resp 15   Ht 5\' 3"  (1.6 m)   Wt 54.4 kg   LMP 10/02/2018   SpO2 100%   BMI 21.26 kg/m   Initial impression: Head injury with temporal scalp laceration  11:14 PM CT performed.  Patient stable.  Wound repaired.  Awaiting results.  {   Click here for ABCD2, HEART and other calculatorsREFRESH Note before signing :1}                              Medical Decision Making Amount and/or Complexity of Data Reviewed Radiology: ordered.  Risk Prescription drug management.   ***  {Document critical care time when appropriate:1} {Document review of labs and clinical decision tools ie heart score, Chads2Vasc2 etc:1}  {Document your independent review of radiology images, and any outside records:1} {Document your discussion with family members, caretakers, and with consultants:1} {Document social determinants of health affecting pt's care:1} {Document  your decision making why or why not admission, treatments were needed:1} Final Clinical Impression(s) / ED Diagnoses Final diagnoses:  Laceration of scalp, initial encounter  Minor head injury, initial encounter    Rx / DC Orders ED Discharge Orders     None

## 2022-11-30 NOTE — ED Provider Notes (Signed)
Care of the patient assumed pending head CT which was normal. Patient informed of results. Wound care instructions given.    Pollyann Savoy, MD 11/30/22 [phone]

## 2023-01-13 ENCOUNTER — Encounter (HOSPITAL_BASED_OUTPATIENT_CLINIC_OR_DEPARTMENT_OTHER): Payer: Self-pay | Admitting: Emergency Medicine

## 2023-01-13 ENCOUNTER — Emergency Department (HOSPITAL_BASED_OUTPATIENT_CLINIC_OR_DEPARTMENT_OTHER): Payer: Managed Care, Other (non HMO)

## 2023-01-13 ENCOUNTER — Other Ambulatory Visit: Payer: Self-pay

## 2023-01-13 ENCOUNTER — Emergency Department (HOSPITAL_BASED_OUTPATIENT_CLINIC_OR_DEPARTMENT_OTHER)
Admission: EM | Admit: 2023-01-13 | Discharge: 2023-01-13 | Disposition: A | Discharge status: Home / Self Care | Payer: Managed Care, Other (non HMO) | Attending: Emergency Medicine | Admitting: Emergency Medicine

## 2023-01-13 DIAGNOSIS — W19XXXA Unspecified fall, initial encounter: Secondary | ICD-10-CM | POA: Diagnosis not present

## 2023-01-13 DIAGNOSIS — M25552 Pain in left hip: Secondary | ICD-10-CM | POA: Insufficient documentation

## 2023-01-13 DIAGNOSIS — S76012A Strain of muscle, fascia and tendon of left hip, initial encounter: Secondary | ICD-10-CM | POA: Insufficient documentation

## 2023-01-13 MED ORDER — CYCLOBENZAPRINE HCL 10 MG PO TABS: 10.0000 mg | ORAL_TABLET | Freq: Two times a day (BID) | ORAL | 0 refills | Status: AC | PRN

## 2023-01-13 MED ORDER — PREDNISONE 20 MG PO TABS: 40.0000 mg | ORAL_TABLET | Freq: Every day | ORAL | 0 refills | Status: AC

## 2023-01-13 MED ORDER — OXYCODONE HCL 5 MG PO TABS: 2.5000 mg | ORAL_TABLET | Freq: Four times a day (QID) | ORAL | 0 refills | Status: AC | PRN

## 2023-01-13 NOTE — ED Notes (Signed)

## 2023-01-13 NOTE — Discharge Instructions (Addendum)
It was a pleasure caring for you today in the emergency department.  If you are still having symptoms after 2 weeks please follow-up with orthopedics  No heavy lifting next 2 weeks  Please return to the emergency department for any worsening or worrisome symptoms.

## 2023-01-13 NOTE — ED Provider Notes (Signed)
[place] [facility] Provider Note  CSN: [id number] Arrival date & time: 01/13/23 1718  Chief Complaint(s) Hip Pain  HPI Misty Webb is a 42 y.o. female with past medical history as below, significant for anxiety, insomnia, seasonal allergies, endometriosis who presents to the ED with complaint of left hip pain.  He was moving a sofa recently, she lost control of the sofa and fell onto her left hip.  She been having intermittent pain to her hip with ambulation since the injury.  More so when abducting her left leg. no numbness or tingling to her lower extremities.  No blood thinners.  No other injuries reported.  She is been taking ibuprofen and naltrexone as her typical regimen for pain control at home with mild improvement to her symptoms.  No abdominal pain that is new, no nausea or vomiting.  No change in bowel or bladder function.  Past Medical History Past Medical History:  Diagnosis Date   Anxiety    Complication of anesthesia    Woke up during endo and tried to pull tube out.  "Easily nauseated"   Family history of adverse reaction to anesthesia    MOTHER NAUSEA.  FATHER - difficulty waking up   GERD (gastroesophageal reflux disease)    Insomnia    Pneumonia    in her 34s   Seasonal allergies    UTI (urinary tract infection)    approx- 1 a year   There are no problems to display for this patient.  Home Medication(s) Prior to Admission medications   Medication Sig Start Date End Date Taking? Authorizing Provider  Biotin w/ Vitamins C & E (HAIR/SKIN/NAILS PO) Take 1 tablet by mouth daily.    [provider]  cetirizine-pseudoephedrine (ZYRTEC-D) 5-120 MG tablet Take 1 tablet by mouth daily.     [provider]  ciprofloxacin (CIPRO) 500 MG tablet Take 1 tablet (500 mg total) by mouth 2 (two) times daily. 10/10/18   Tilden Fossa, MD  clonazePAM (KLONOPIN) 1 MG tablet Take 0.25-0.5 mg by mouth daily as needed for  anxiety.    [provider]  ibuprofen (ADVIL,MOTRIN) 800 MG tablet Take 1 tablet (800 mg total) by mouth every 8 (eight) hours as needed for headache. 06/16/15   Charlsie Quest, MD  metroNIDAZOLE (FLAGYL) 500 MG tablet Take 1 tablet (500 mg total) by mouth 2 (two) times daily. 10/10/18   Tilden Fossa, MD  omeprazole (PRILOSEC) 20 MG capsule Take 20 mg by mouth daily. Reported on 06/16/2015    [provider]  predniSONE (DELTASONE) 20 MG tablet Take 3 tablets (60 mg total) by mouth daily. 10/10/18   Tilden Fossa, MD  zolpidem (AMBIEN) 10 MG tablet Take 10 mg by mouth at bedtime as needed for sleep.    [provider]  Past Surgical History Past Surgical History:  Procedure Laterality Date   ABDOMINAL HYSTERECTOMY     BREAST SURGERY     REPAIR EXTENSOR TENDON Left 06/13/2015   Procedure: Reconstruction Extensor Tendon Left Foot;  Surgeon: Nadara Mustard, MD;  Location: [facility] OR;  Service: Orthopedics;  Laterality: Left;   UPPER GASTROINTESTINAL ENDOSCOPY     Family History History reviewed. No pertinent family history.  Social History Social History   Tobacco Use   Smoking status: Never   Smokeless tobacco: Never  Vaping Use   Vaping status: Never Used  Substance Use Topics   Alcohol use: Yes    Comment: rarely   Drug use: No   Allergies Acetaminophen, Latex, and Penicillins  Review of Systems Review of Systems  Constitutional:  Negative for chills.  Respiratory:  Negative for chest tightness.   Cardiovascular:  Negative for chest pain.  Gastrointestinal:  Negative for nausea and vomiting.  Genitourinary:  Negative for dysuria.  Musculoskeletal:  Positive for arthralgias.  All other systems reviewed and are negative.   Physical Exam Vital Signs  I have reviewed the triage vital signs BP 102/77   Pulse 100   Temp  98.7 F (37.1 C) (Oral)   Resp 18   Ht 5\' 3"  (1.6 m)   Wt 54.4 kg   LMP 10/02/2018   SpO2 100%   BMI 21.26 kg/m  Physical Exam Vitals and nursing note reviewed.  Constitutional:      General: She is not in acute distress.    Appearance: Normal appearance. She is well-developed. She is not ill-appearing.  HENT:     Head: Normocephalic and atraumatic.     Right Ear: External ear normal.     Left Ear: External ear normal.     Nose: Nose normal.     Mouth/Throat:     Mouth: Mucous membranes are moist.  Eyes:     General: No scleral icterus.       Right eye: No discharge.        Left eye: No discharge.  Cardiovascular:     Rate and Rhythm: Normal rate.  Pulmonary:     Effort: Pulmonary effort is normal. No respiratory distress.     Breath sounds: No stridor.  Abdominal:     General: Abdomen is flat. There is no distension.     Tenderness: There is no guarding.  Musculoskeletal:        General: No deformity.     Cervical back: No rigidity.       Legs:     Comments: LE NVI LLE compartments soft No significant pain to left knee or left ankle on palpation. Gait steady  Skin:    General: Skin is warm and dry.     Coloration: Skin is not cyanotic, jaundiced or pale.  Neurological:     Mental Status: She is alert and oriented to person, place, and time.     GCS: GCS eye subscore is 4. GCS verbal subscore is 5. GCS motor subscore is 6.  Psychiatric:        Speech: Speech normal.        Behavior: Behavior normal. Behavior is cooperative.     ED Results and Treatments Labs (all labs ordered are listed, but only abnormal results are displayed) Labs Reviewed - No data to display  Radiology DG Hip Unilat With Pelvis 2-3 Views Left  Result Date: 01/13/2023 CLINICAL DATA:  Fall, left hip pain EXAM: DG HIP (WITH OR WITHOUT PELVIS) 2-3V LEFT COMPARISON:  None  Available. FINDINGS: There is no evidence of hip fracture or dislocation. There is no evidence of arthropathy or other focal bone abnormality. 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 IMPRESSION: Negative. Electronically Signed   By: Helyn Numbers M.D.   On: 01/13/2023 20:09    Pertinent labs & imaging results that were available during my care of the patient were reviewed by me and considered in my medical decision making (see MDM for details).  Medications Ordered in ED Medications - No data to display                                                                                                                                   Procedures Procedures  (including critical care time)  Medical Decision Making / ED Course    Medical Decision Making:    TYLOR GAMBRILL is a 42 y.o. female with past medical history as below, significant for anxiety, insomnia, seasonal allergies, endometriosis who presents to the ED with complaint of left hip pain.. The complaint involves an extensive differential diagnosis and also carries with it a high risk of complications and morbidity.  Serious etiology was considered. Ddx includes but is not limited to:, Strain, dislocation, fracture, soft tissue, etc  Complete initial physical exam performed, notably the patient  was no acute stress, sitting in chair.    Reviewed and confirmed nursing documentation for past medical history, family history, social history.  Vital signs reviewed.        X-ray ordered in triage, unremarkable  Her exam is reassuring, compartments soft, LE NVI.  Favor likely soft tissue injury, sprain  Recommend supportive care at home, activity restriction, no heavy lifting, follow-up orthopedics   The patient improved significantly and was discharged in stable condition. Detailed discussions were had with the patient regarding current findings, and need for close f/u with  PCP or on call doctor. The patient has been instructed to return immediately if the symptoms worsen in any way for re-evaluation. Patient verbalized understanding and is in agreement with current care plan. All questions answered prior to discharge.                   Additional history obtained: -Additional history obtained from na -External records from outside source obtained and reviewed including: Chart review including previous notes, labs, imaging, consultation notes including  PDMP Home meds Primary care recommendation   Lab Tests: na  EKG   EKG Interpretation Date/Time:    Ventricular Rate:    PR Interval:    QRS Duration:    QT Interval:    QTC Calculation:   R Axis:      Text Interpretation:  Imaging Studies ordered: I ordered imaging studies including hip xr I independently visualized the following imaging with scope of interpretation limited to determining acute life threatening conditions related to emergency care; findings noted above I independently visualized and interpreted imaging. I agree with the radiologist interpretation   Medicines ordered and prescription drug management: No orders of the defined types were placed in this encounter.   -I have reviewed the patients home medicines and have made adjustments as needed   Consultations Obtained: na   Cardiac Monitoring: Continuous pulse oximetry interpreted by myself, 100% on RA.    Social Determinants of Health:  Diagnosis or treatment significantly limited by social determinants of health: lives at home   Reevaluation: After the interventions noted above, I reevaluated the patient and found that they have improved  Co morbidities that complicate the patient evaluation  Past Medical History:  Diagnosis Date   Anxiety    Complication of anesthesia    Woke up during endo and tried to pull tube out.  "Easily nauseated"   Family history of adverse reaction to  anesthesia    MOTHER NAUSEA.  FATHER - difficulty waking up   GERD (gastroesophageal reflux disease)    Insomnia    Pneumonia    in her 1s   Seasonal allergies    UTI (urinary tract infection)    approx- 1 a year      Dispostion: Disposition decision including need for hospitalization was considered, and patient discharged from emergency department.    Final Clinical Impression(s) / ED Diagnoses Final diagnoses:  None        Sloan Leiter, DO 01/13/23 2041

## 2023-01-13 NOTE — ED Triage Notes (Signed)
Pt POV steady gait- Pt c/o L hip pain after moving furniture last night. Reports bruising to R upper thigh where furniture struck hip.
